# Patient Record
Sex: Male | Born: 1950
Health system: Southern US, Community
[De-identification: ages and names within clinical notes are randomized; demographics above are authoritative.]

## PROBLEM LIST (undated history)

## (undated) DIAGNOSIS — N289 Disorder of kidney and ureter, unspecified: Secondary | ICD-10-CM

---

## 2005-05-05 ENCOUNTER — Ambulatory Visit: Payer: Self-pay | Admitting: Internal Medicine

## 2005-05-07 DIAGNOSIS — C439 Malignant melanoma of skin, unspecified: Secondary | ICD-10-CM

## 2005-05-07 HISTORY — DX: Malignant melanoma of skin, unspecified: C43.9

## 2005-09-08 ENCOUNTER — Ambulatory Visit: Payer: Self-pay | Admitting: Internal Medicine

## 2006-02-09 ENCOUNTER — Ambulatory Visit: Payer: Self-pay | Admitting: Internal Medicine

## 2006-02-12 ENCOUNTER — Ambulatory Visit: Payer: Self-pay | Admitting: Cardiology

## 2006-03-23 ENCOUNTER — Ambulatory Visit: Payer: Self-pay | Admitting: Gastroenterology

## 2006-05-22 ENCOUNTER — Ambulatory Visit: Payer: Self-pay | Admitting: Internal Medicine

## 2007-06-22 ENCOUNTER — Ambulatory Visit: Payer: Self-pay | Admitting: Internal Medicine

## 2007-06-22 LAB — CONVERTED CEMR LAB
ALT: 42 units/L (ref 0–53)
AST: 53 units/L — ABNORMAL HIGH (ref 0–37)
Alkaline Phosphatase: 97 units/L (ref 39–117)
BUN: 10 mg/dL (ref 6–23)
Basophils Absolute: 0.1 10*3/uL (ref 0.0–0.1)
Basophils Relative: 1 % (ref 0.0–1.0)
Bilirubin, Direct: 0.1 mg/dL (ref 0.0–0.3)
CO2: 32 meq/L (ref 19–32)
CRP, High Sensitivity: 0.9
Chloride: 104 meq/L (ref 96–112)
Creatinine, Ser: 0.7 mg/dL (ref 0.4–1.5)
Eosinophils Relative: 4.8 % (ref 0.0–5.0)
GFR calc Af Amer: 151 mL/min
Glucose, Bld: 100 mg/dL — ABNORMAL HIGH (ref 70–99)
HCT: 39.6 % (ref 39.0–52.0)
Hemoglobin: 14.2 g/dL (ref 13.0–17.0)
Monocytes Absolute: 0.5 10*3/uL (ref 0.2–0.7)
Potassium: 4.4 meq/L (ref 3.5–5.1)
Total CHOL/HDL Ratio: 7.4
Triglycerides: 260 mg/dL (ref 0–149)
VLDL: 52 mg/dL — ABNORMAL HIGH (ref 0–40)
WBC: 6.4 10*3/uL (ref 4.5–10.5)

## 2007-07-01 ENCOUNTER — Ambulatory Visit: Payer: Self-pay | Admitting: Internal Medicine

## 2008-01-10 ENCOUNTER — Encounter: Payer: Self-pay | Admitting: Internal Medicine

## 2008-01-10 DIAGNOSIS — M1990 Unspecified osteoarthritis, unspecified site: Secondary | ICD-10-CM | POA: Insufficient documentation

## 2008-01-10 DIAGNOSIS — E663 Overweight: Secondary | ICD-10-CM | POA: Insufficient documentation

## 2008-01-10 DIAGNOSIS — M545 Low back pain: Secondary | ICD-10-CM

## 2008-01-10 DIAGNOSIS — B351 Tinea unguium: Secondary | ICD-10-CM | POA: Insufficient documentation

## 2008-01-10 DIAGNOSIS — K8020 Calculus of gallbladder without cholecystitis without obstruction: Secondary | ICD-10-CM | POA: Insufficient documentation

## 2008-06-26 ENCOUNTER — Ambulatory Visit: Payer: Self-pay | Admitting: Internal Medicine

## 2008-06-26 ENCOUNTER — Ambulatory Visit: Payer: Self-pay

## 2008-06-26 DIAGNOSIS — M2550 Pain in unspecified joint: Secondary | ICD-10-CM | POA: Insufficient documentation

## 2008-06-26 DIAGNOSIS — R609 Edema, unspecified: Secondary | ICD-10-CM

## 2008-11-28 DIAGNOSIS — C4491 Basal cell carcinoma of skin, unspecified: Secondary | ICD-10-CM

## 2008-11-28 HISTORY — DX: Basal cell carcinoma of skin, unspecified: C44.91

## 2011-01-07 NOTE — Assessment & Plan Note (Signed)
[facility]                             PULMONARY OFFICE NOTE   MARGARET, STAGGS                       MRN:          [id number]  DATE:07/01/2007                            DOB:          03/09/1951    HISTORY:  A 60 year old white male in for followup evaluation of obesity  complicated by hyperlipidemia.  He has made no progress in terms of  weight loss, but is careful about diet.  A year ago he was taking  multiple supplements which he stopped now.  The only thing he takes is a  baby aspirin daily.  He enjoys good health, however, with no significant  exertional chest pain, orthopnea, PND or leg swelling.   He also has no significant history of any TIA or claudication symptoms,  orthopnea, PND or leg swelling.   PAST MEDICAL HISTORY:  1. Mild obesity with target weight of 210.  2. Intermittent low back pain.  3. Intermittent neck pain and stiffness chronically.  4. Mild degenerative joint disease of the right elbow.  5. Onychomycosis.  6. Asymptomatic cholelithiasis, documented June 2007.  7. Acute urolithiasis within the past 3 months, resolved.   ALLERGIES:  None known.   MEDICATIONS:  Baby aspirin 1 daily.   SOCIAL HISTORY:  He has never smoked.  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  Previously  he was walking with his wife regularly.  She has developed breast cancer  and so he stopped walking and gained 5 pounds subsequently.   FAMILY HISTORY:  Positive for stroke in his father at age 30.  No one  has ever had premature heart disease or stroke.  Mother had colon cancer  in her 64s.  He is the next-to-the-last child of 4 siblings, none of  whom have premature heart disease, but all of whom have cholesterol  elevation.  No positive colon cancer, no prostate cancer in his family  to his knowledge.   REVIEW OF SYSTEMS:  Taken in detail on the worksheet, negative except as  outlined  above.   PHYSICAL EXAMINATION:  This is a pleasant, ambulatory white male in no  acute distress.  He is afebrile with normal vital signs with a weight of 255 pounds, up 5  pounds from previous visit, his all-time high, with a blood pressure of  110/64.  HEENT:  Ocular exam was done with limited funduscopy revealing no  significant retinal arterial change.  Nasal turbinates were normal.  Ear  canals were clear bilaterally.  Oropharynx was clear.  Dentition was  intact.  NECK:  Supple without cervical adenopathy or tenderness.  Trachea is  midline, no thyromegaly.  Carotid upstrokes were brisk, no bruits.  Lung fields are perfectly clear bilaterally to auscultation and  percussion.  HEART:  Regular rhythm without murmurs, gallops, or rubs with no  displacement of PMI.  ABDOMEN:  Soft, benign with no palpable organomegaly, masses or  tenderness.  No evidence of aortic enlargement.  The femoral pulses were present bilaterally, no bruits.  GENITOURINARY:  Testes descended bilaterally, no nodules.  He was  uncircumcised.  RECTAL:  Revealed mild BPH, smooth texture.  Stool was guaiac-negative.  No nodules.  EXTREMITIES:  Warm without calf tenderness, cyanosis, clubbing, or  edema.  He had minimal varicose veins and symmetric pulses.  NEUROLOGIC:  No focal deficits or pathologic reflexes.  MUSCULOSKELETAL EXAM:  Entirely normal except for minimum reduction in C-  spine rotation and abduction   LABORATORY DATA:  His LDL cholesterol is down from 180 a year ago to 168  now with a normal CRP.  His fasting blood sugar is 100 with normal  kidney function and liver function tests.  His TSH is normal and his HDL  is 38 which is unchanged from a year ago.   IMPRESSION:  1. Hyperlipidemia.  Target LDL less than 160 based on the fact that he      has no premature heart disease or stroke in his family.  I have      given him the options today which included starting a statin      because all of  his family is on these.  However, he would like to      work on continued diet and exercise, as despite gaining 5 pounds      since a year ago with diet, he has actually reduced his LDL by 20      points.  I have given him specific guidelines to help him and      offered nutrition consult although he declined this today.  2. Moderate obesity complicated by hyperlipidemia.  I have given him a      target weight of 210 or less, and explained very carefully to him      calorie balance issues and how it is possible to do this without      medications.  3. Health maintenance.  Record review indicates he was due a tetanus      shot.  We will have to call him back for this.  4. Nephrolithiasis.  I have recommended plenty of fluids and urology      followup p.r.n.  5. Asymptomatic cholelithiasis, no treatment needed.  6. Degenerative joint disease.  I have reminded him regarding the use      of ibuprofen on a p.r.n. basis.  7. Positive family history for colon cancer.  He is due for followup      by Dr. Cleotis Nipper in 2011.     Charlaine Dalton. Sherene Sires, MD, [facility]  Electronically Signed    MBW/MedQ  DD: 07/01/2007  DT: 07/01/2007  Job #: [id number]

## 2011-01-10 NOTE — Assessment & Plan Note (Signed)
[facility]                           GASTROENTEROLOGY OFFICE NOTE   NAME:Micheal [name]                       MRN:          [id number]  DATE:03/23/2006                            DOB:          21-Nov-1950    REASON FOR CONSULTATION:  Chest pain.   HISTORY OF PRESENT ILLNESS:  Mr. Micheal [name] is [name] pleasant 60 year old white  male referred through the courtesy of Dr. Sherene Sires for evaluation.  The last  nine months, he has been complaining of left lower chest discomfort.  When  he coughs, sneezes or has [name] bowel movement, he feels [name] fluttering sensation  described as movement in his left subcostal area.  It lasts seconds at [name]  time and is nonpainful.  He denies nausea, vomiting, pyrosis or abdominal  pain.  He underwent [name] chest CT in June 2007 that demonstrated [name] gallbladder  stone only.  Chest x-ray showed borderline cardiomegaly and questionable  mild congestion.   FAMILY HISTORY:  Pertinent for mother with colon cancer.  He was  colonoscoped in December 2006 which was normal.   PAST MEDICAL HISTORY:  Unremarkable.   FAMILY HISTORY:  As stated above.   MEDICATIONS:  Baby aspirin.   SOCIAL HISTORY:  He neither smokes nor drinks.  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   REVIEW OF SYSTEMS:  Reviewed and was negative.   PHYSICAL EXAMINATION:  VITAL SIGNS:  Pulse 68, blood pressure 110/60, weight  248.  HEENT:  EOMI. PERRLA. Sclerae are anicteric.  Conjunctivae are pink.  NECK:  Supple without thyromegaly, adenopathy or carotid bruits.  CHEST:  There are no areas of tenderness to palpation.  CARDIAC:  Regular rhythm; normal S1 S2.  There are no murmurs, gallops or  rubs.  ABDOMEN:  Bowel sounds are normoactive.  Abdomen is soft, non-tender and non-  distended.  There are no abdominal masses, tenderness, splenic enlargement  or hepatomegaly.  EXTREMITIES:  Full range of motion.  No cyanosis, clubbing or edema.  RECTAL:  Deferred.    IMPRESSION:  1.  Musculoskeletal chest pain.  2.  Cholelithiasis, asymptomatic.  3.  Family history of colorectal CA.   RECOMMENDATION:  1.  No further GI workup.  2.  Follow up colonoscopy in approximately four years.                                   Barbette Hair. Arlyce Dice, MD, [facility]   RDK/MedQ  DD:  03/23/2006  DT:  03/23/2006  Job #:  [id number]

## 2011-01-10 NOTE — Assessment & Plan Note (Signed)
HEALTHCARE                               PULMONARY OFFICE NOTE   CALLOWAY, ANDRUS A                       MRN:          [id number]  DATE:05/22/2006                            DOB:          07/20/1951    PRIMARY SERVICE HEALTHCARE EVALUATION   CHIEF COMPLAINT:  Neck pain.   HISTORY:  A 60 year old white male followed here for obesity complicated by  hyperlipidemia presents with 1 year history of intermittent neck stiffness  with no radicular features.  He denies any changes with weather,  environmental changes, associated headache, nausea, or exertional symptoms.   PAST MEDICAL HISTORY:  1. Mild obesity with a target weight of around 210.  2. Intermittent low back pain.  3. Mild DJD the right elbow.  4. Onychomycosis.  5. Asymptomatic cholelithiasis (see abdominal CT scan February 12, 2006 for      evaluation of left upper quadrant pain, which has resolved).   ALLERGIES:  None known.   MEDICINES:  1. Baby aspirin 1 daily.  2. Multivite 1 daily.  3. Multiple alternative agents listed in the med sheet dated May 22, 2006.   SOCIAL HISTORY:  He has never smoked.  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   FAMILY HISTORY:  Positive for stroke in his father at age 25.  Mother had  colon cancer in her 99s.  He has 3 brothers, 2 older, 1 younger, all healthy  with no premature heart disease in the family.  Everyone has high  cholesterol.  No prostate or colon cancer to his knowledge.   REVIEW OF SYSTEMS:  Taken in detail on the worksheet.  Significant for the  problems as outlined above.   PHYSICAL EXAMINATION:  This is a pleasant, ambulatory, robust white male in  no acute distress with a weight of 245 pounds, which is up another 5 pounds  form a year ago.  HEENT:  Ocular exam was done with limited funduscopy revealing no  significant retinal arterial change.  Oropharynx  is clear.  Dentition is  intact.  Nasal turbinates normal.  Ear canals are clear bilaterally.  NECK:  Supple without cervical adenopathy or tenderness.  There was a small  lipoma present below the left occiput.  There was minimal crepitans.  Carotid upstrokes were brisk bilaterally.  No bruits.  CHEST:  Completely clear bilaterally to auscultation and percussion.  There  is a regular rate and rhythm without murmur, gallop, or rub present.  ABDOMEN:  Soft, benign with no palpable organomegaly, mass, or tenderness.  GENITOURINARY:  Testes descended bilaterally.  No nodules. No evidence of  inguinal hernia.  RECTAL:  Mild BPH, smooth texture.  Stool guaiac was negative.  EXTREMITIES:  Warm without calf tenderness, cyanosis, clubbing, edema.  Pedal pulses were present bilaterally.  NEUROLOGIC:  No focal deficits or pathologic reflex.  SKIN:  Warm and dry.   LAB DATA:  EKG was normal.  Chest x-ray was normal.  Serum cholesterol  revealed  an LDL of 183 with an HDL of 37 and a CRP was normal.  Chemistry  profile, TSH were normal.  Urinalysis was unremarkable.   IMPRESSION:  1. New onset musculoskeletal neck pain.  He does have a small lipoma      beneath his left occiput, but I do not believe this is directly related      to the pain.  I have recommended neck-stretching exercises with p.r.n.      nonsteroidals and spent extra time teaching him optimal neck      calisthenics, both in terms of isometric and isotonic exercises that he      can do.  If this is not satisfactory, an orthopedic evaluation should      be considered.  2. Obesity complicated by hyperlipidemia.  His target LDL is less than 160      in the absence of additional risk factors.  He was 180 a year ago and      is 183 now, which is not surprising given the fact that his weight is      gradually increasing.  Advised him by phone tree that the options are:      a.     Accept a higher risk of cardiovascular morbidity mortality,  or      b.     Follow a reasonable diet and exercise program, or      c.     Start a Statin.  Followup either way should be every 3 months       sooner if needed.  3. Health maintenance:  He should receive a tetanus in 2008.  I gave him      the opportunity to do prostate specific antigen screening, which he      declined after being informed of the risks, benefits, and alternatives      (given the fact that he is Caucasian with negative family history and      normal exam the false-positive rate for prostate specific antigen far      exceeds the true-positives).            ______________________________  Charlaine Dalton Sherene Sires, MD, [facility]      MBW/MedQ  DD:  05/25/2006  DT:  05/26/2006  Job #:  [id number]

## 2012-11-09 DIAGNOSIS — D229 Melanocytic nevi, unspecified: Secondary | ICD-10-CM

## 2012-11-09 HISTORY — DX: Melanocytic nevi, unspecified: D22.9

## 2013-08-31 ENCOUNTER — Ambulatory Visit (INDEPENDENT_AMBULATORY_CARE_PROVIDER_SITE_OTHER): Payer: BC Managed Care – PPO | Admitting: Podiatry

## 2013-08-31 ENCOUNTER — Ambulatory Visit (INDEPENDENT_AMBULATORY_CARE_PROVIDER_SITE_OTHER): Payer: BC Managed Care – PPO

## 2013-08-31 ENCOUNTER — Encounter: Payer: Self-pay | Admitting: Podiatry

## 2013-08-31 VITALS — BP 131/69 | HR 58 | Resp 16 | Ht 73.0 in | Wt 240.0 lb

## 2013-08-31 DIAGNOSIS — M722 Plantar fascial fibromatosis: Secondary | ICD-10-CM

## 2013-08-31 MED ORDER — TRIAMCINOLONE ACETONIDE 10 MG/ML IJ SUSP
10.0000 mg | Freq: Once | INTRAMUSCULAR | Status: AC
  Administered 2013-08-31: 10 mg

## 2013-08-31 MED ORDER — MELOXICAM 15 MG PO TABS: 15.0000 mg | ORAL_TABLET | Freq: Every day | ORAL | Status: DC

## 2013-08-31 NOTE — Patient Instructions (Signed)
Plantar Fasciitis (Heel Spur Syndrome) with Rehab The plantar fascia is a fibrous, ligament-like, soft-tissue structure that spans the bottom of the foot. Plantar fasciitis is a condition that causes pain in the foot due to inflammation of the tissue. SYMPTOMS   Pain and tenderness on the underneath side of the foot.  Pain that worsens with standing or walking. CAUSES  Plantar fasciitis is caused by irritation and injury to the plantar fascia on the underneath side of the foot. Common mechanisms of injury include:  Direct trauma to bottom of the foot.  Damage to a small nerve that runs under the foot where the main fascia attaches to the heel bone.  Stress placed on the plantar fascia due to bone spurs. RISK INCREASES WITH:   Activities that place stress on the plantar fascia (running, jumping, pivoting, or cutting).  Poor strength and flexibility.  Improperly fitted shoes.  Tight calf muscles.  Flat feet.  Failure to warm-up properly before activity.  Obesity. PREVENTION  Warm up and stretch properly before activity.  Allow for adequate recovery between workouts.  Maintain physical fitness:  Strength, flexibility, and endurance.  Cardiovascular fitness.  Maintain a health body weight.  Avoid stress on the plantar fascia.  Wear properly fitted shoes, including arch supports for individuals who have flat feet.  PROGNOSIS  If treated properly, then the symptoms of plantar fasciitis usually resolve without surgery. However, occasionally surgery is necessary.  RELATED COMPLICATIONS   Recurrent symptoms that may result in a chronic condition.  Problems of the lower back that are caused by compensating for the injury, such as limping.  Pain or weakness of the foot during push-off following surgery.  Chronic inflammation, scarring, and partial or complete fascia tear, occurring more often from repeated injections.  TREATMENT  Treatment initially involves the  use of ice and medication to help reduce pain and inflammation. The use of strengthening and stretching exercises may help reduce pain with activity, especially stretches of the Achilles tendon. These exercises may be performed at home or with a therapist. Your caregiver may recommend that you use heel cups of arch supports to help reduce stress on the plantar fascia. Occasionally, corticosteroid injections are given to reduce inflammation. If symptoms persist for greater than 6 months despite non-surgical (conservative), then surgery may be recommended.   MEDICATION   If pain medication is necessary, then nonsteroidal anti-inflammatory medications, such as aspirin and ibuprofen, or other minor pain relievers, such as acetaminophen, are often recommended.  Do not take pain medication within 7 days before surgery.  Prescription pain relievers may be given if deemed necessary by your caregiver. Use only as directed and only as much as you need.  Corticosteroid injections may be given by your caregiver. These injections should be reserved for the most serious cases, because they may only be given a certain number of times.  HEAT AND COLD  Cold treatment (icing) relieves pain and reduces inflammation. Cold treatment should be applied for 10 to 15 minutes every 2 to 3 hours for inflammation and pain and immediately after any activity that aggravates your symptoms. Use ice packs or massage the area with a piece of ice (ice massage).  Heat treatment may be used prior to performing the stretching and strengthening activities prescribed by your caregiver, physical therapist, or athletic trainer. Use a heat pack or soak the injury in warm water.  SEEK IMMEDIATE MEDICAL CARE IF:  Treatment seems to offer no benefit, or the condition worsens.  Any medications   produce adverse side effects.  EXERCISES- RANGE OF MOTION (ROM) AND STRETCHING EXERCISES - Plantar Fasciitis (Heel Spur Syndrome) These exercises  may help you when beginning to rehabilitate your injury. Your symptoms may resolve with or without further involvement from your physician, physical therapist or athletic trainer. While completing these exercises, remember:   Restoring tissue flexibility helps normal motion to return to the joints. This allows healthier, less painful movement and activity.  An effective stretch should be held for at least 30 seconds.  A stretch should never be painful. You should only feel a gentle lengthening or release in the stretched tissue.  RANGE OF MOTION - Toe Extension, Flexion  Sit with your right / left leg crossed over your opposite knee.  Grasp your toes and gently pull them back toward the top of your foot. You should feel a stretch on the bottom of your toes and/or foot.  Hold this stretch for 10 seconds.  Now, gently pull your toes toward the bottom of your foot. You should feel a stretch on the top of your toes and or foot.  Hold this stretch for 10 seconds. Repeat  times. Complete this stretch 3 times per day.   RANGE OF MOTION - Ankle Dorsiflexion, Active Assisted  Remove shoes and sit on a chair that is preferably not on a carpeted surface.  Place right / left foot under knee. Extend your opposite leg for support.  Keeping your heel down, slide your right / left foot back toward the chair until you feel a stretch at your ankle or calf. If you do not feel a stretch, slide your bottom forward to the edge of the chair, while still keeping your heel down.  Hold this stretch for 10 seconds. Repeat 3 times. Complete this stretch 2 times per day.   STRETCH  Gastroc, Standing  Place hands on wall.  Extend right / left leg, keeping the front knee somewhat bent.  Slightly point your toes inward on your back foot.  Keeping your right / left heel on the floor and your knee straight, shift your weight toward the wall, not allowing your back to arch.  You should feel a gentle stretch  in the right / left calf. Hold this position for 10 seconds. Repeat 3 times. Complete this stretch 2 times per day.  STRETCH  Soleus, Standing  Place hands on wall.  Extend right / left leg, keeping the other knee somewhat bent.  Slightly point your toes inward on your back foot.  Keep your right / left heel on the floor, bend your back knee, and slightly shift your weight over the back leg so that you feel a gentle stretch deep in your back calf.  Hold this position for 10 seconds. Repeat 3 times. Complete this stretch 2 times per day.  STRETCH  Gastrocsoleus, Standing  Note: This exercise can place a lot of stress on your foot and ankle. Please complete this exercise only if specifically instructed by your caregiver.   Place the ball of your right / left foot on a step, keeping your other foot firmly on the same step.  Hold on to the wall or a rail for balance.  Slowly lift your other foot, allowing your body weight to press your heel down over the edge of the step.  You should feel a stretch in your right / left calf.  Hold this position for 10 seconds.  Repeat this exercise with a slight bend in your right /   left knee. Repeat 3 times. Complete this stretch 2 times per day.   STRENGTHENING EXERCISES - Plantar Fasciitis (Heel Spur Syndrome)  These exercises may help you when beginning to rehabilitate your injury. They may resolve your symptoms with or without further involvement from your physician, physical therapist or athletic trainer. While completing these exercises, remember:   Muscles can gain both the endurance and the strength needed for everyday activities through controlled exercises.  Complete these exercises as instructed by your physician, physical therapist or athletic trainer. Progress the resistance and repetitions only as guided.  STRENGTH - Towel Curls  Sit in a chair positioned on a non-carpeted surface.  Place your foot on a towel, keeping your heel  on the floor.  Pull the towel toward your heel by only curling your toes. Keep your heel on the floor. Repeat 3 times. Complete this exercise 2 times per day.  STRENGTH - Ankle Inversion  Secure one end of a rubber exercise band/tubing to a fixed object (table, pole). Loop the other end around your foot just before your toes.  Place your fists between your knees. This will focus your strengthening at your ankle.  Slowly, pull your big toe up and in, making sure the band/tubing is positioned to resist the entire motion.  Hold this position for 10 seconds.  Have your muscles resist the band/tubing as it slowly pulls your foot back to the starting position. Repeat 3 times. Complete this exercises 2 times per day.  Document Released: 08/11/2005 Document Revised: 11/03/2011 Document Reviewed: 11/23/2008 [employer] Patient Information 2014 [employer]  Onychomycosis/Fungal Toenails  WHAT IS IT? An infection that lies within the keratin of your nail plate that is caused by a fungus.  WHY ME? Fungal infections affect all ages, sexes, races, and creeds.  There may be many factors that predispose you to a fungal infection such as age, coexisting medical conditions such as diabetes, or an autoimmune disease; stress, medications, fatigue, genetics, etc.  Bottom line: fungus thrives in a warm, moist environment and your shoes offer such a location.  IS IT CONTAGIOUS? Theoretically, yes.  You do not want to share shoes, nail clippers or files with someone who has fungal toenails.  Walking around barefoot in the same room or sleeping in the same bed is unlikely to transfer the organism.  It is important to realize, however, that fungus can spread easily from one nail to the next on the same foot.  HOW DO WE TREAT THIS?  There are several ways to treat this condition.  Treatment may depend on many factors such as age, medications, pregnancy, liver and kidney conditions, etc.  It is best to ask your  doctor which options are available to you.  1. No treatment.   Unlike many other medical concerns, you can live with this condition.  However for many people this can be a painful condition and may lead to ingrown toenails or a bacterial infection.  It is recommended that you keep the nails cut short to help reduce the amount of fungal nail. 2. Topical treatment.  These range from herbal remedies to prescription strength nail lacquers.  About 40-50% effective, topicals require twice daily application for approximately 9 to 12 months or until an entirely new nail has grown out.  The most effective topicals are medical grade medications available through physicians offices. 3. Oral antifungal medications.  With an 80-90% cure rate, the most common oral medication requires 3 to 4 months of therapy and stays   in your system for a year as the new nail grows out.  Oral antifungal medications do require blood work to make sure it is a safe drug for you.  A liver function panel will be performed prior to starting the medication and after the first month of treatment.  It is important to have the blood work performed to avoid any harmful side effects.  In general, this medication safe but blood work is required. 4. Laser Therapy.  This treatment is performed by applying a specialized laser to the affected nail plate.  This therapy is noninvasive, fast, and non-painful.  It is not covered by insurance and is therefore, out of pocket.  The results have been very good with a 80-95% cure rate.  The [facility] is the only practice in the area to offer this therapy. 5. Permanent Nail Avulsion.  Removing the entire nail so that a new nail will not grow back. 

## 2013-08-31 NOTE — Progress Notes (Signed)
   Subjective:    Patient ID: Micheal [name], male    DOB: [birth year], 63 y.o.   MRN: [id number]  HPI Comments: "My heel hurts"  Pt states that he has had burning and aching in the plantar heel right foot for about 3-4 weeks. It is getting worse. Has tried insoles in shoes and Ibuprofen with he said helped at least 80%.   Noticed that pt had thick and discolored toenails on both feet. I talked with him about laser treatment since pt has seen a dermatologist and was recommended NOT to use oral meds. He is currently using apple cider vinegar soaks to help appearance.  Foot Pain      Review of Systems  Cardiovascular: Positive for leg swelling.  Musculoskeletal: Positive for back pain.  Allergic/Immunologic: Positive for environmental allergies.  All other systems reviewed and are negative.       Objective:   Physical Exam        Assessment & Plan:

## 2013-09-01 NOTE — Progress Notes (Signed)
Subjective:     Patient ID: Micheal [name], male   DOB: Apr 02, 1951, 63 y.o.   MRN: [id number]  Foot Pain   patient presents with pain in the right heel of at least one-month duration that is worse after sitting and now hurts at all times during the day. Does not remember injury to the area and does not remember any other trauma that may have occurred. Patient is found to have brittle thickened nails 1-5 both feet that have been checked by a dermatologist   Review of Systems  All other systems reviewed and are negative.       Objective:   Physical Exam  Nursing note and vitals reviewed. Constitutional: He is oriented to person, place, and time.  Cardiovascular: Intact distal pulses.   Musculoskeletal: Normal range of motion.  Neurological: He is oriented to person, place, and time.   neurovascular status intact with no health history changes noted and muscle strength adequate with no equinus. Patient is found to have discomfort on the plantar right heel at the insertion of the tendon into the calcaneus and is also noted to have thick yellow to form nails 1-5 both feet that are nontender     Assessment:     Plantar fasciitis of the right heel and mycotic nail infection 1-5 of both feet    Plan:     H&P performed in both conditions discussed along with x-ray review. Injected the right plantar fascia 3 mg Kenalog 5 mg Xylocaine Marcaine mixture and applied fascially brace with instructions and discussed the possibility of nail treatment which we will defer currently reappoint in 1 week to recheck

## 2013-09-07 ENCOUNTER — Ambulatory Visit: Payer: BC Managed Care – PPO | Admitting: Podiatry

## 2013-09-19 ENCOUNTER — Ambulatory Visit (INDEPENDENT_AMBULATORY_CARE_PROVIDER_SITE_OTHER): Payer: BC Managed Care – PPO | Admitting: Podiatry

## 2013-09-19 ENCOUNTER — Encounter: Payer: Self-pay | Admitting: Podiatry

## 2013-09-19 VITALS — BP 129/70 | HR 73 | Resp 16

## 2013-09-19 DIAGNOSIS — M722 Plantar fascial fibromatosis: Secondary | ICD-10-CM

## 2013-09-19 DIAGNOSIS — B351 Tinea unguium: Secondary | ICD-10-CM

## 2013-09-19 LAB — HEPATIC FUNCTION PANEL
ALT: 25 U/L (ref 0–53)
AST: 35 U/L (ref 0–37)
Albumin: 4.3 g/dL (ref 3.5–5.2)
Alkaline Phosphatase: 98 U/L (ref 39–117)
BILIRUBIN DIRECT: 0.1 mg/dL (ref 0.0–0.3)
BILIRUBIN INDIRECT: 0.6 mg/dL (ref 0.0–0.9)
TOTAL PROTEIN: 7 g/dL (ref 6.0–8.3)
Total Bilirubin: 0.7 mg/dL (ref 0.3–1.2)

## 2013-09-19 MED ORDER — TERBINAFINE HCL 250 MG PO TABS
250.0000 mg | ORAL_TABLET | Freq: Every day | ORAL | Status: DC
Start: 2013-09-19 — End: 2021-10-08

## 2013-09-19 MED ORDER — TRIAMCINOLONE ACETONIDE 10 MG/ML IJ SUSP
10.0000 mg | Freq: Once | INTRAMUSCULAR | Status: AC
  Administered 2013-09-19: 10 mg

## 2013-09-19 NOTE — Progress Notes (Signed)
Subjective:     Patient ID: Micheal [name], male   DOB: October 04, 1950, 63 y.o.   MRN: [id number]  HPI patient presents stating my toenails are still thick and my heel is improving quite a bit but still tender   Review of Systems     Objective:   Physical Exam Neurovascular status unchanged with no change in health history and discomfort still noted plantar aspect right heel but improved from previously and nail disease 1-5 both feet    Assessment:     Plantar fasciitis improved but still present right and mycotic nail infection x10    Plan:     Reinjected the plantar fascia 3 mg Kenalog sounds like a Marcaine mixture and instructed on oral topical and possibility for laser in the future for the nail disease. Lamisil 90 days after blood work to check for liver function will be initiated

## 2013-10-07 ENCOUNTER — Telehealth: Payer: Self-pay | Admitting: *Deleted

## 2013-10-07 NOTE — Telephone Encounter (Signed)
Pt asked for lab results so he could begin his Lamisil.  Dr Valentina Lucks reviewed the 09/19/2013 lab and states can begin Lamisil.  Orders to pt.

## 2013-12-19 ENCOUNTER — Telehealth: Payer: Self-pay | Admitting: *Deleted

## 2013-12-19 NOTE — Telephone Encounter (Signed)
My husband asked me to call.  He finished the medicine for fungus one month ago.  Where do we go from here?  He doesn't have a follow-up appointment.  What's the next step?  Call me back, he's difficult to reach.  I left her a message to call and schedule a follow-up appointment with Dr. Paulla Dolly in about a month for a toenail check.

## 2014-01-23 ENCOUNTER — Ambulatory Visit: Payer: BC Managed Care – PPO | Admitting: Podiatry

## 2014-06-16 ENCOUNTER — Other Ambulatory Visit: Payer: Self-pay | Admitting: Internal Medicine

## 2014-06-16 ENCOUNTER — Ambulatory Visit
Admission: RE | Admit: 2014-06-16 | Discharge: 2014-06-16 | Disposition: A | Discharge status: Home / Self Care | Payer: BC Managed Care – PPO | Source: Ambulatory Visit | Attending: Internal Medicine | Admitting: Internal Medicine

## 2014-06-16 DIAGNOSIS — Z579 Occupational exposure to unspecified risk factor: Secondary | ICD-10-CM

## 2018-02-02 DIAGNOSIS — H2513 Age-related nuclear cataract, bilateral: Secondary | ICD-10-CM | POA: Diagnosis not present

## 2018-02-02 DIAGNOSIS — H43813 Vitreous degeneration, bilateral: Secondary | ICD-10-CM | POA: Diagnosis not present

## 2018-02-02 DIAGNOSIS — H40023 Open angle with borderline findings, high risk, bilateral: Secondary | ICD-10-CM | POA: Diagnosis not present

## 2018-03-27 ENCOUNTER — Emergency Department (HOSPITAL_COMMUNITY)
Admission: EM | Admit: 2018-03-27 | Discharge: 2018-03-28 | Disposition: A | Discharge status: Home / Self Care | Payer: Medicare Other | Attending: Emergency Medicine | Admitting: Emergency Medicine

## 2018-03-27 ENCOUNTER — Emergency Department (HOSPITAL_COMMUNITY): Payer: Medicare Other

## 2018-03-27 ENCOUNTER — Other Ambulatory Visit: Payer: Self-pay

## 2018-03-27 ENCOUNTER — Encounter (HOSPITAL_COMMUNITY): Payer: Self-pay | Admitting: Emergency Medicine

## 2018-03-27 DIAGNOSIS — Z79899 Other long term (current) drug therapy: Secondary | ICD-10-CM | POA: Diagnosis not present

## 2018-03-27 DIAGNOSIS — N132 Hydronephrosis with renal and ureteral calculous obstruction: Secondary | ICD-10-CM | POA: Diagnosis not present

## 2018-03-27 DIAGNOSIS — Z7982 Long term (current) use of aspirin: Secondary | ICD-10-CM | POA: Insufficient documentation

## 2018-03-27 DIAGNOSIS — R109 Unspecified abdominal pain: Secondary | ICD-10-CM | POA: Diagnosis not present

## 2018-03-27 DIAGNOSIS — K8020 Calculus of gallbladder without cholecystitis without obstruction: Secondary | ICD-10-CM

## 2018-03-27 DIAGNOSIS — N23 Unspecified renal colic: Secondary | ICD-10-CM | POA: Diagnosis not present

## 2018-03-27 DIAGNOSIS — R1031 Right lower quadrant pain: Secondary | ICD-10-CM | POA: Diagnosis not present

## 2018-03-27 HISTORY — DX: Disorder of kidney and ureter, unspecified: N28.9

## 2018-03-27 LAB — URINALYSIS, ROUTINE W REFLEX MICROSCOPIC
BACTERIA UA: NONE SEEN
Bilirubin Urine: NEGATIVE
Glucose, UA: NEGATIVE mg/dL
Ketones, ur: NEGATIVE mg/dL
Leukocytes, UA: NEGATIVE
Nitrite: NEGATIVE
PROTEIN: NEGATIVE mg/dL
Specific Gravity, Urine: 1.027 (ref 1.005–1.030)
pH: 5 (ref 5.0–8.0)

## 2018-03-27 LAB — BASIC METABOLIC PANEL
ANION GAP: 8 (ref 5–15)
BUN: 23 mg/dL (ref 8–23)
CO2: 24 mmol/L (ref 22–32)
Calcium: 8.9 mg/dL (ref 8.9–10.3)
Chloride: 107 mmol/L (ref 98–111)
Creatinine, Ser: 1.01 mg/dL (ref 0.61–1.24)
GFR calc Af Amer: >60 mL/min (ref >60)
Glucose, Bld: 113 mg/dL — ABNORMAL HIGH (ref 70–99)
Potassium: 4.1 mmol/L (ref 3.5–5.1)
SODIUM: 139 mmol/L (ref 135–145)

## 2018-03-27 LAB — CBC
HEMATOCRIT: 41.7 % (ref 39.0–52.0)
HEMOGLOBIN: 13.9 g/dL (ref 13.0–17.0)
MCH: 30 pg (ref 26.0–34.0)
MCHC: 33.3 g/dL (ref 30.0–36.0)
MCV: 90.1 fL (ref 78.0–100.0)
Platelets: 253 10*3/uL (ref 150–400)
RBC: 4.63 MIL/uL (ref 4.22–5.81)
RDW: 12.5 % (ref 11.5–15.5)
WBC: 10.8 10*3/uL — AB (ref 4.0–10.5)

## 2018-03-27 MED ORDER — OXYCODONE-ACETAMINOPHEN 5-325 MG PO TABS
1.0000 | ORAL_TABLET | Freq: Once | ORAL | Status: AC
  Administered 2018-03-27: 1 via ORAL
  Filled 2018-03-27: qty 1

## 2018-03-27 MED ORDER — ONDANSETRON 4 MG PO TBDP
4.0000 mg | ORAL_TABLET | Freq: Once | ORAL | Status: AC | PRN
  Administered 2018-03-27: 4 mg via ORAL
  Filled 2018-03-27: qty 1

## 2018-03-27 NOTE — ED Triage Notes (Signed)
Pt c/o right sided flank pain x 2 days, denies urinary symptoms, hx kidney stones. C/o nausea, denies vomiting.

## 2018-03-28 DIAGNOSIS — N23 Unspecified renal colic: Secondary | ICD-10-CM | POA: Diagnosis not present

## 2018-03-28 MED ORDER — ONDANSETRON 4 MG PO TBDP: ORAL_TABLET | ORAL | 0 refills | Status: DC

## 2018-03-28 MED ORDER — KETOROLAC TROMETHAMINE 60 MG/2ML IM SOLN
60.0000 mg | Freq: Once | INTRAMUSCULAR | Status: AC
  Administered 2018-03-28: 60 mg via INTRAMUSCULAR
  Filled 2018-03-28: qty 2

## 2018-03-28 MED ORDER — OXYCODONE HCL 5 MG PO TABS: 5.0000 mg | ORAL_TABLET | Freq: Four times a day (QID) | ORAL | 0 refills | Status: AC | PRN

## 2018-03-28 MED ORDER — TAMSULOSIN HCL 0.4 MG PO CAPS: 0.4000 mg | ORAL_CAPSULE | Freq: Two times a day (BID) | ORAL | 0 refills | Status: DC

## 2018-03-28 NOTE — ED Provider Notes (Signed)
[facility]PART[facility] Provider Note   CSN: [id number] Arrival date & time: 03/27/18  1824     History   Chief Complaint Chief Complaint  Patient presents with  . Flank Pain    HPI Micheal [name] is a 67 y.o. male with a hx of nephrolithiasis approximately 1 decade ago presents to the Emergency Department complaining of gradual, persistent, progressively worsening right flank pain onset earlier this afternoon.  Patient reported associated radiation of the pain to his right lower abdomen.  Additionally he has had nausea without vomiting.  Patient reports that 10 years ago when he had his last kidney stone he did have vomiting.  Patient reports the pain is similar to that.  Patient reports taking ibuprofen with moderate relief of his pain.  Nothing seems to make his pain worse.  Patient was given Percocet here in the emergency department with moderate relief and Zofran with relief of his nausea.  Pt denies fever, chills, headache, neck pain, chest pain, shortness of breath, vomiting, weakness, dizziness, syncope.     The history is provided by the patient and medical records. No language interpreter was used.    Past Medical History:  Diagnosis Date  . Renal disorder     Patient Active Problem List   Diagnosis Date Noted  . ARTHRALGIA UNSPECIFIED SITE 06/26/2008  . EDEMA 06/26/2008  . ONYCHOMYCOSIS 01/10/2008  . OBESITY, MILD 01/10/2008  . CHOLELITHIASIS, ASYMPTOMATIC 01/10/2008  . DEGENERATIVE JOINT DISEASE 01/10/2008  . LOW BACK PAIN, MILD 01/10/2008    History reviewed. No pertinent surgical history.      Home Medications    Prior to Admission medications   Medication Sig Start Date End Date Taking? Authorizing Provider  aspirin 81 MG tablet Take 81 mg by mouth daily.    [provider]  DOXYCYCLINE HYCLATE PO Take by mouth.    [provider]  meloxicam (MOBIC) 15 MG tablet Take 1 tablet (15 mg total) by mouth daily.  08/31/13   Wallene Huh, DPM  ondansetron (ZOFRAN ODT) 4 MG disintegrating tablet 4mg  ODT q4 hours prn nausea/vomit 03/28/18   Inaya Gillham, Jarrett Soho, PA-C  oxyCODONE (ROXICODONE) 5 MG immediate release tablet Take 1 tablet (5 mg total) by mouth every 6 (six) hours as needed for severe pain or breakthrough pain. 03/28/18   Duward Allbritton, Jarrett Soho, PA-C  tamsulosin (FLOMAX) 0.4 MG CAPS capsule Take 1 capsule (0.4 mg total) by mouth 2 (two) times daily. 03/28/18   Graves Nipp, Jarrett Soho, PA-C  terbinafine (LAMISIL) 250 MG tablet Take 1 tablet (250 mg total) by mouth daily. 09/19/13   Wallene Huh, DPM    Family History No family history on file.  Social History Social History   Tobacco Use  . Smoking status: Never Smoker  . Smokeless tobacco: Never Used  Substance Use Topics  . Alcohol use: No  . Drug use: Not on file     Allergies   Patient has no known allergies.   Review of Systems Review of Systems  Constitutional: Negative for appetite change, diaphoresis, fatigue, fever and unexpected weight change.  HENT: Negative for mouth sores.   Eyes: Negative for visual disturbance.  Respiratory: Negative for cough, chest tightness, shortness of breath and wheezing.   Cardiovascular: Negative for chest pain.  Gastrointestinal: Positive for nausea. Negative for abdominal pain, constipation, diarrhea and vomiting.  Endocrine: Negative for polydipsia, polyphagia and polyuria.  Genitourinary: Positive for flank pain. Negative for dysuria, frequency, hematuria and urgency.  Musculoskeletal: Negative for  back pain and neck stiffness.  Skin: Negative for rash.  Allergic/Immunologic: Negative for immunocompromised state.  Neurological: Negative for syncope, light-headedness and headaches.  Hematological: Does not bruise/bleed easily.  Psychiatric/Behavioral: Negative for sleep disturbance. The patient is not nervous/anxious.      Physical Exam Updated Vital Signs BP (!) 161/70   Pulse (!) 52    Temp 98.1 F (36.7 C) (Oral)   Resp 15   SpO2 96%   Physical Exam  Constitutional: He appears well-developed and well-nourished. No distress.  Awake, alert, nontoxic appearance  HENT:  Head: Normocephalic and atraumatic.  Mouth/Throat: Oropharynx is clear and moist. No oropharyngeal exudate.  Eyes: Conjunctivae are normal. No scleral icterus.  Neck: Normal range of motion. Neck supple.  Cardiovascular: Normal rate, regular rhythm and intact distal pulses.  Pulmonary/Chest: Effort normal and breath sounds normal. No respiratory distress. He has no wheezes.  Equal chest expansion  Abdominal: Soft. Bowel sounds are normal. He exhibits no mass. There is no tenderness. There is CVA tenderness ( right). There is no rebound and no guarding.  Musculoskeletal: Normal range of motion. He exhibits no edema.  Neurological: He is alert.  Speech is clear and goal oriented Moves extremities without ataxia  Skin: Skin is warm and dry. He is not diaphoretic.  Psychiatric: He has a normal mood and affect.  Nursing note and vitals reviewed.    ED Treatments / Results  Labs (all labs ordered are listed, but only abnormal results are displayed) Labs Reviewed  URINALYSIS, ROUTINE W REFLEX MICROSCOPIC - Abnormal; Notable for the following components:      Result Value   Hgb urine dipstick MODERATE (*)    All other components within normal limits  BASIC METABOLIC PANEL - Abnormal; Notable for the following components:   Glucose, Bld 113 (*)    All other components within normal limits  CBC - Abnormal; Notable for the following components:   WBC 10.8 (*)    All other components within normal limits     Radiology Ct Renal Stone Study  Result Date: 03/27/2018 CLINICAL DATA:  67 y/o  M; right flank pain for 2 days. EXAM: CT ABDOMEN AND PELVIS WITHOUT CONTRAST TECHNIQUE: Multidetector CT imaging of the abdomen and pelvis was performed following the standard protocol without IV contrast.  COMPARISON:  02/12/2006 CT of the abdomen and pelvis. FINDINGS: Lower chest: No acute abnormality. Hepatobiliary: No focal liver abnormality is seen. Cholelithiasis. No gallbladder wall thickening or biliary ductal dilatation. Pancreas: Unremarkable. No pancreatic ductal dilatation or surrounding inflammatory changes. Spleen: Normal in size without focal abnormality. Adrenals/Urinary Tract: Normal adrenal glands. Normal left kidney. No left hydronephrosis. Mild right hydronephrosis and perinephric stranding with 3 mm stone in the distal right ureter at the ureterovesicular junction (series 3, image 83). Normal bladder. Stomach/Bowel: Stomach is within normal limits. Appendix appears normal. No evidence of bowel wall thickening, distention, or inflammatory changes. Vascular/Lymphatic: Aortic atherosclerosis. No enlarged abdominal or pelvic lymph nodes. Reproductive: Prostatic calcifications. Other: No abdominal wall hernia or abnormality. No abdominopelvic ascites. Musculoskeletal: Diffuse mixed lucency and sclerosis throughout the left ilium with cortical thickening compatible with Paget's disease. Mild levocurvature of the lumbar spine and grade 1 L3-4 anterolisthesis. Lumbar spondylosis with multilevel disc and prominent facet degenerative changes greatest at the L4-5 and L5-S1 levels. No acute fracture. IMPRESSION: 1. Mild right hydronephrosis with a 3 mm stone in the distal right ureter at the ureterovesicular junction. 2. Cholelithiasis. 3. Aortic atherosclerosis. Electronically Signed   By: Mia Creek  [place]-Stratton M.D.   On: 03/27/2018 22:59    Procedures Procedures (including critical care time)  Medications Ordered in ED Medications  ondansetron (ZOFRAN-ODT) disintegrating tablet 4 mg (4 mg Oral Given 03/27/18 1839)  oxyCODONE-acetaminophen (PERCOCET/ROXICET) 5-325 MG per tablet 1 tablet (1 tablet Oral Given 03/27/18 2055)  oxyCODONE-acetaminophen (PERCOCET/ROXICET) 5-325 MG per tablet 1 tablet (1  tablet Oral Given 03/27/18 2356)  ketorolac (TORADOL) injection 60 mg (60 mg Intramuscular Given 03/28/18 0028)     Initial Impression / Assessment and Plan / ED Course  I have reviewed the triage vital signs and the nursing notes.  Pertinent labs & imaging results that were available during my care of the patient were reviewed by me and considered in my medical decision making (see chart for details).     Patient presents to the emergency department with right-sided flank pain.  Labs show slightly elevated white blood cell count and moderate hemoglobin in his urine but no evidence of urinary tract infection.  Creatinine is normal today.  Patient has no significant medical problems.  He is noted to be hypertensive however I suspect this is secondary to his pain.  He has no chest pain or shortness of breath.  No evidence of hypertensive urgency.  CT scan shows 3 mm stone at the right UVJ with mild hydronephrosis.  No significant hydronephrosis.  Incidentally, there are gallstones noted on CT scan.  I personally evaluated these images and have notified the patient of this incidental finding.  On repeat abdominal exam he has no tenderness to palpation in the right upper quadrant.  Patient declined additional pain control initially but after p.o. trial stated his pain had increased some.  Patient given Percocet and Toradol with significant relief.  No vomiting here in the emergency department.  Be discharged home with conservative therapies and close follow-up with urology and primary care.  Patient and family state understanding and are in agreement with this plan.   Final Clinical Impressions(s) / ED Diagnoses   Final diagnoses:  Right flank pain  Renal colic on right side  Ureteral stone with hydronephrosis  Calculus of gallbladder without cholecystitis without obstruction    ED Discharge Orders        Ordered    oxyCODONE (ROXICODONE) 5 MG immediate release tablet  Every 6 hours PRN      03/28/18 0016    ondansetron (ZOFRAN ODT) 4 MG disintegrating tablet     03/28/18 0016    tamsulosin (FLOMAX) 0.4 MG CAPS capsule  2 times daily     03/28/18 0016       Addalyn Speedy, Gwenlyn Perking 03/28/18 Lou Cal, MD 03/30/18 1725

## 2018-03-28 NOTE — Discharge Instructions (Addendum)
1. Medications: zofran, oxycodone for breakthrough pain, flomax, usual home medications 2. Treatment: rest, drink plenty of fluids, advance diet slowly 3. Follow Up: Please followup with your primary doctor in 2 days and urology in 3-5 days for discussion of your diagnoses and further evaluation after today's visit; Please return to the ER for persistent vomiting, high fevers or worsening symptoms

## 2018-06-29 DIAGNOSIS — M79672 Pain in left foot: Secondary | ICD-10-CM | POA: Diagnosis not present

## 2018-06-29 DIAGNOSIS — Z23 Encounter for immunization: Secondary | ICD-10-CM | POA: Diagnosis not present

## 2018-06-29 DIAGNOSIS — K8020 Calculus of gallbladder without cholecystitis without obstruction: Secondary | ICD-10-CM | POA: Diagnosis not present

## 2018-06-29 DIAGNOSIS — Z85820 Personal history of malignant melanoma of skin: Secondary | ICD-10-CM | POA: Diagnosis not present

## 2018-06-29 DIAGNOSIS — Z125 Encounter for screening for malignant neoplasm of prostate: Secondary | ICD-10-CM | POA: Diagnosis not present

## 2018-06-29 DIAGNOSIS — Z1389 Encounter for screening for other disorder: Secondary | ICD-10-CM | POA: Diagnosis not present

## 2018-06-29 DIAGNOSIS — E7800 Pure hypercholesterolemia, unspecified: Secondary | ICD-10-CM | POA: Diagnosis not present

## 2018-06-29 DIAGNOSIS — Z0000 Encounter for general adult medical examination without abnormal findings: Secondary | ICD-10-CM | POA: Diagnosis not present

## 2018-06-29 DIAGNOSIS — M79671 Pain in right foot: Secondary | ICD-10-CM | POA: Diagnosis not present

## 2018-06-29 DIAGNOSIS — Z1159 Encounter for screening for other viral diseases: Secondary | ICD-10-CM | POA: Diagnosis not present

## 2018-07-05 ENCOUNTER — Encounter: Payer: Self-pay | Admitting: Neurology

## 2018-07-06 ENCOUNTER — Other Ambulatory Visit: Payer: Self-pay | Admitting: *Deleted

## 2018-07-06 DIAGNOSIS — R200 Anesthesia of skin: Secondary | ICD-10-CM

## 2018-08-26 ENCOUNTER — Ambulatory Visit (INDEPENDENT_AMBULATORY_CARE_PROVIDER_SITE_OTHER): Payer: Medicare Other | Admitting: Neurology

## 2018-08-26 DIAGNOSIS — R200 Anesthesia of skin: Secondary | ICD-10-CM

## 2018-08-26 NOTE — Procedures (Signed)
[facility]  [address], [address]  [place], [place] [address] Tel: [phone] Fax:  [phone] Test Date:  08/26/2018  Patient: Micheal [name] DOB: [birth year] Physician: Narda Amber, DO  Sex: Male Height: 5\' 10"  Ref Phys: Lavone Orn, MD  ID#: [id number] Temp: 38.0C Technician:    Patient Complaints: This is a 68 year old man referred for evaluation of bilateral feet pain and paresthesias.  NCV & EMG Findings: Electrodiagnostic testing of the right lower extremity and additional studies of the left shows:  1. Bilateral sural and superficial peroneal sensory responses are within normal limits.  2. Bilateral peroneal and tibial motor responses are within normal limits.  3. Bilateral tibial H reflex studies are within normal limits.  4. There is no evidence of active or chronic motor axonal changes affecting any of the tested muscles.  Motor unit configuration and recruitment pattern is within normal limits.  Impression: This is a normal study of the lower extremities.  In particular, there is no evidence of a sensorimotor polyneuropathy or lumbosacral radiculopathy.       ___________________________ Narda Amber, DO    Nerve Conduction Studies Anti Sensory Summary Table   Site NR Peak (ms) Norm Peak (ms) P-T Amp (V) Norm P-T Amp  Left Sup Peroneal Anti Sensory (Ant Lat Mall)  38C  12 cm    2.6 <4.6 11.5 >3  Right Sup Peroneal Anti Sensory (Ant Lat Mall)  38C  12 cm    2.2 <4.6 10.7 >3  Left Sural Anti Sensory (Lat Mall)  38C  Calf    3.3 <4.6 13.0 >3  Right Sural Anti Sensory (Lat Mall)  38C  Calf    3.2 <4.6 11.4 >3   Motor Summary Table   Site NR Onset (ms) Norm Onset (ms) O-P Amp (mV) Norm O-P Amp Site1 Site2 Delta-0 (ms) Dist (cm) Vel (m/s) Norm Vel (m/s)  Left Peroneal Motor (Ext Dig Brev)  38C  Ankle    2.9 <6.0 4.9 >2.5 B Fib Ankle 7.6 39.0 51 >40  B Fib    10.5  4.1  Poplt B Fib 1.5 8.0 53 >40  Poplt    12.0  4.0         Right  Peroneal Motor (Ext Dig Brev)  38C  Ankle    3.8 <6.0 3.1 >2.5 B Fib Ankle 7.8 37.0 47 >40  B Fib    11.6  2.4  Poplt B Fib 1.7 8.0 47 >40  Poplt    13.3  2.4         Left Tibial Motor (Abd Hall Brev)  38C  Ankle    4.0 <6.0 9.6 >4 Knee Ankle 9.3 43.0 46 >40  Knee    13.3  7.4         Right Tibial Motor (Abd Hall Brev)  38C  Ankle    4.0 <6.0 11.7 >4 Knee Ankle 8.8 40.0 45 >40  Knee    12.8  7.2          H Reflex Studies   NR H-Lat (ms) Lat Norm (ms) L-R H-Lat (ms)  Left Tibial (Gastroc)  38C     34.01 <35 0.00  Right Tibial (Gastroc)  38C     34.01 <35 0.00   EMG   Side Muscle Ins Act Fibs Psw Fasc Number Recrt Dur Dur. Amp Amp. Poly Poly. Comment  Right AntTibialis Nml Nml Nml Nml Nml Nml Nml Nml Nml Nml Nml Nml N/A  Right Gastroc Nml  Nml Nml Nml Nml Nml Nml Nml Nml Nml Nml Nml N/A  Right Flex Dig Long Nml Nml Nml Nml Nml Nml Nml Nml Nml Nml Nml Nml N/A  Right RectFemoris Nml Nml Nml Nml Nml Nml Nml Nml Nml Nml Nml Nml N/A  Right GluteusMed Nml Nml Nml Nml Nml Nml Nml Nml Nml Nml Nml Nml N/A  Left AntTibialis Nml Nml Nml Nml Nml Nml Nml Nml Nml Nml Nml Nml N/A  Left Gastroc Nml Nml Nml Nml Nml Nml Nml Nml Nml Nml Nml Nml N/A  Left Flex Dig Long Nml Nml Nml Nml Nml Nml Nml Nml Nml Nml Nml Nml N/A  Left RectFemoris Nml Nml Nml Nml Nml Nml Nml Nml Nml Nml Nml Nml N/A  Left GluteusMed Nml Nml Nml Nml Nml Nml Nml Nml Nml Nml Nml Nml N/A      Waveforms:

## 2018-08-27 DIAGNOSIS — H5203 Hypermetropia, bilateral: Secondary | ICD-10-CM | POA: Diagnosis not present

## 2018-08-27 DIAGNOSIS — H52223 Regular astigmatism, bilateral: Secondary | ICD-10-CM | POA: Diagnosis not present

## 2018-08-27 DIAGNOSIS — H524 Presbyopia: Secondary | ICD-10-CM | POA: Diagnosis not present

## 2018-08-27 DIAGNOSIS — H40023 Open angle with borderline findings, high risk, bilateral: Secondary | ICD-10-CM | POA: Diagnosis not present

## 2018-08-27 DIAGNOSIS — H43813 Vitreous degeneration, bilateral: Secondary | ICD-10-CM | POA: Diagnosis not present

## 2018-08-27 DIAGNOSIS — H2513 Age-related nuclear cataract, bilateral: Secondary | ICD-10-CM | POA: Diagnosis not present

## 2018-10-01 DIAGNOSIS — M79671 Pain in right foot: Secondary | ICD-10-CM | POA: Diagnosis not present

## 2018-10-01 DIAGNOSIS — M79672 Pain in left foot: Secondary | ICD-10-CM | POA: Diagnosis not present

## 2018-10-06 ENCOUNTER — Telehealth: Payer: Self-pay | Admitting: Neurology

## 2018-10-06 DIAGNOSIS — M25572 Pain in left ankle and joints of left foot: Secondary | ICD-10-CM | POA: Diagnosis not present

## 2018-10-06 DIAGNOSIS — M79675 Pain in left toe(s): Secondary | ICD-10-CM | POA: Diagnosis not present

## 2018-10-06 DIAGNOSIS — M25571 Pain in right ankle and joints of right foot: Secondary | ICD-10-CM | POA: Diagnosis not present

## 2018-10-06 NOTE — Telephone Encounter (Signed)
Results faxed again 

## 2018-10-06 NOTE — Telephone Encounter (Signed)
Dr. Was wanting EMG Results faxed again to [phone] because the other pages were messed up. Thanks!

## 2019-06-14 DIAGNOSIS — L821 Other seborrheic keratosis: Secondary | ICD-10-CM | POA: Diagnosis not present

## 2019-06-14 DIAGNOSIS — D229 Melanocytic nevi, unspecified: Secondary | ICD-10-CM | POA: Diagnosis not present

## 2019-06-14 DIAGNOSIS — Z85820 Personal history of malignant melanoma of skin: Secondary | ICD-10-CM | POA: Diagnosis not present

## 2019-07-05 DIAGNOSIS — Z0000 Encounter for general adult medical examination without abnormal findings: Secondary | ICD-10-CM | POA: Diagnosis not present

## 2019-07-05 DIAGNOSIS — E7800 Pure hypercholesterolemia, unspecified: Secondary | ICD-10-CM | POA: Diagnosis not present

## 2019-07-05 DIAGNOSIS — Z5181 Encounter for therapeutic drug level monitoring: Secondary | ICD-10-CM | POA: Diagnosis not present

## 2019-07-05 DIAGNOSIS — Z23 Encounter for immunization: Secondary | ICD-10-CM | POA: Diagnosis not present

## 2019-07-05 DIAGNOSIS — R079 Chest pain, unspecified: Secondary | ICD-10-CM | POA: Diagnosis not present

## 2019-07-05 DIAGNOSIS — Z1389 Encounter for screening for other disorder: Secondary | ICD-10-CM | POA: Diagnosis not present

## 2019-07-05 DIAGNOSIS — Z85820 Personal history of malignant melanoma of skin: Secondary | ICD-10-CM | POA: Diagnosis not present

## 2019-08-05 IMAGING — CT CT RENAL STONE PROTOCOL
2 of 4 series · 17 of 46 positions shown, 19 images · non-contrast
Comparison: 02/12/2006 CT of the abdomen and pelvis.

CLINICAL DATA: 67 y/o  M; right flank pain for 2 days.

EXAM:
CT ABDOMEN AND PELVIS WITHOUT CONTRAST
TECHNIQUE: Multidetector CT imaging of the abdomen and pelvis was performed
following the standard protocol without IV contrast.

[Series 3: renal stone 5.0 · axial · 0.97mm/px · z∈[+725,+1195]mm · 14 of 104 slices shown, 16 images]
[im 5/104  soft-tissue]
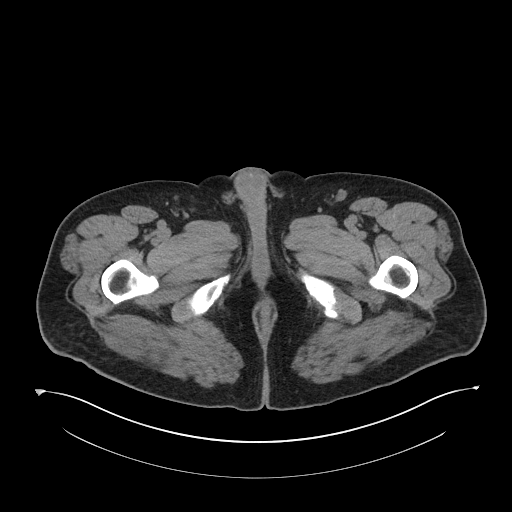
[im 5/104  bone]
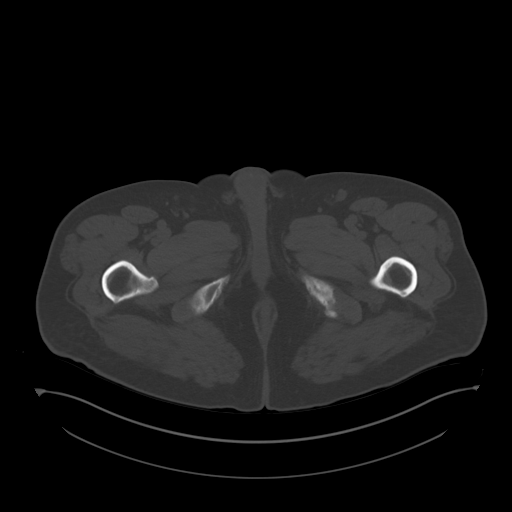
[im 13/104  soft-tissue]
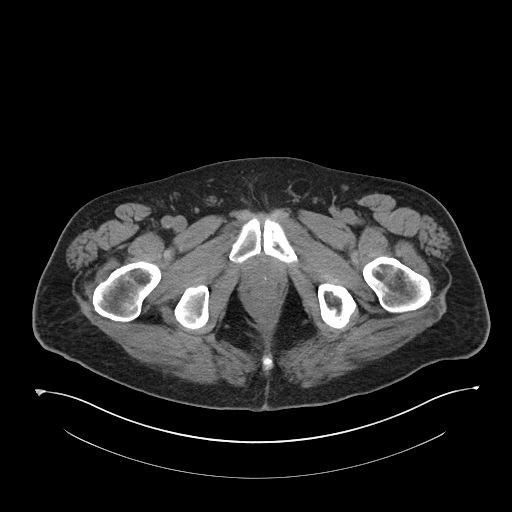
[im 22/104  soft-tissue]
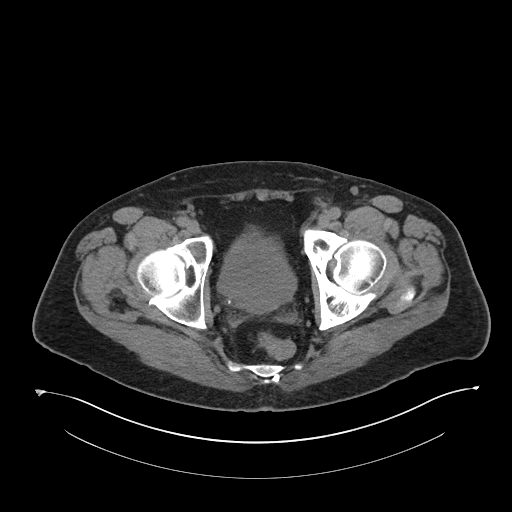
[im 26/104  soft-tissue]
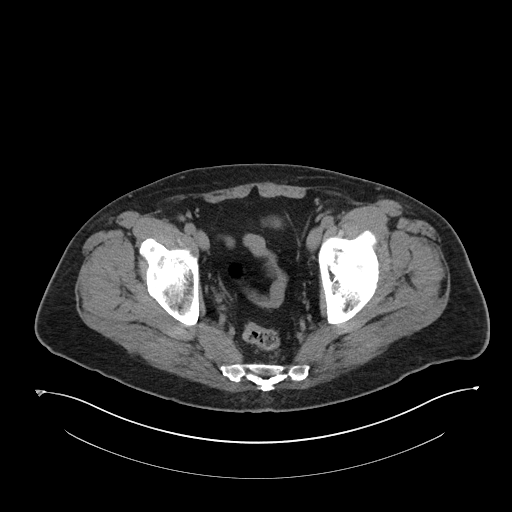
[im 35/104  soft-tissue]
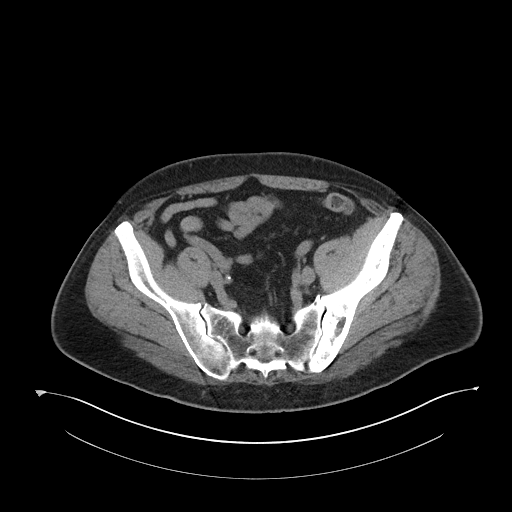
[im 43/104  soft-tissue]
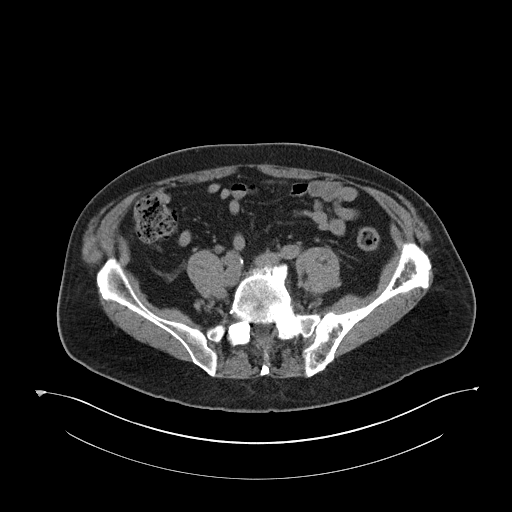
[im 48/104  soft-tissue]
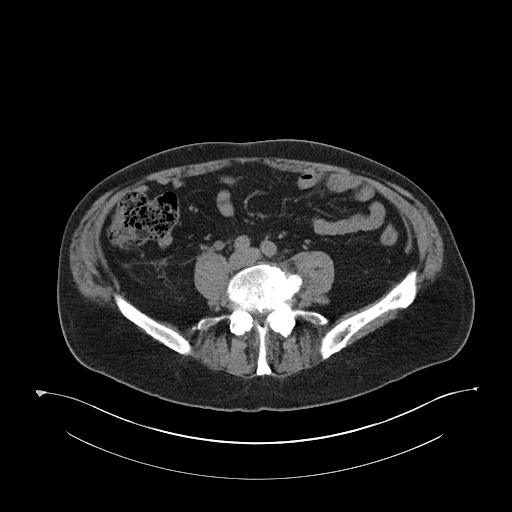
[im 56/104  soft-tissue]
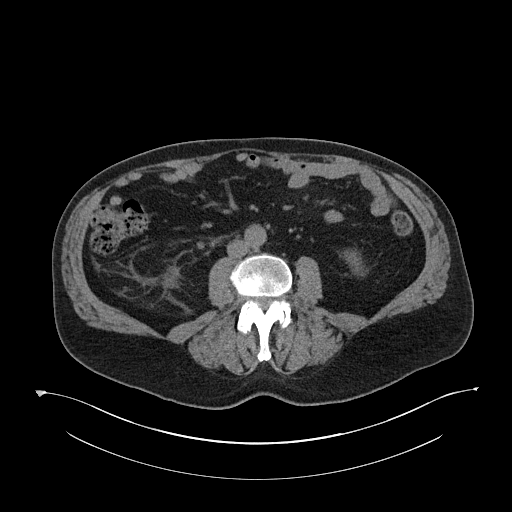
[im 61/104  soft-tissue]
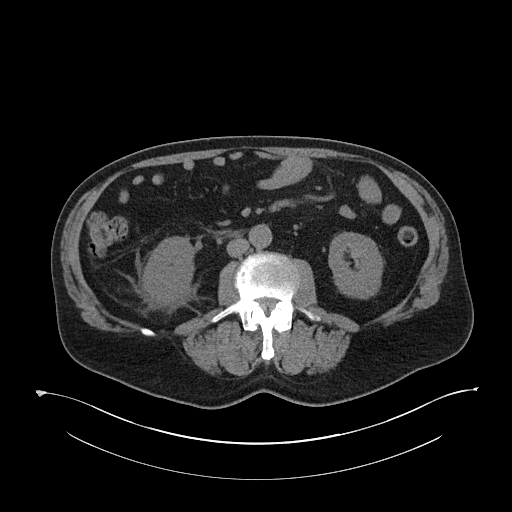
[im 61/104  bone]
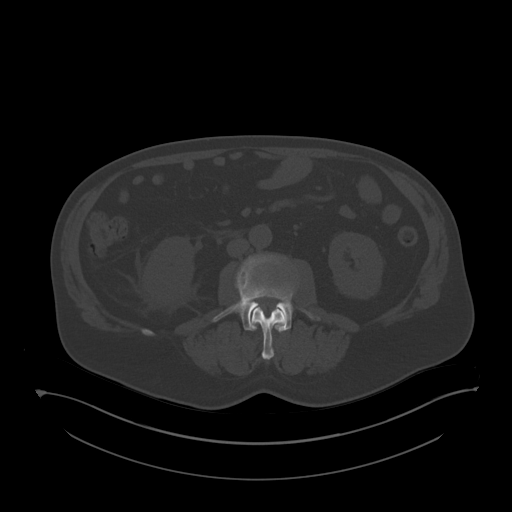
[im 69/104  soft-tissue]
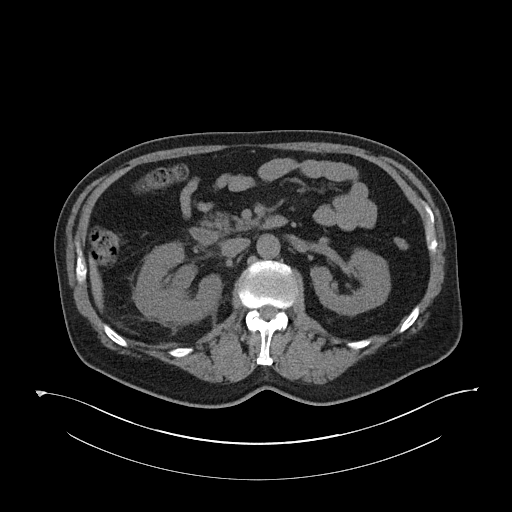
[im 78/104  soft-tissue]
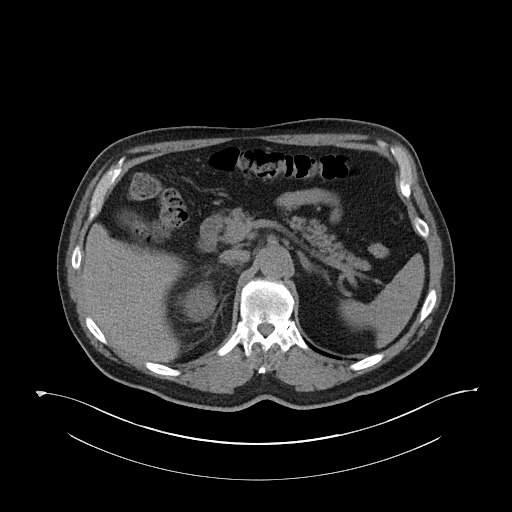
[im 82/104  soft-tissue]
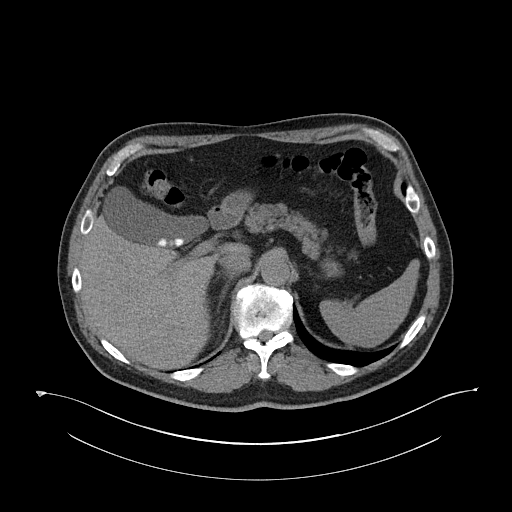
[im 91/104  soft-tissue]
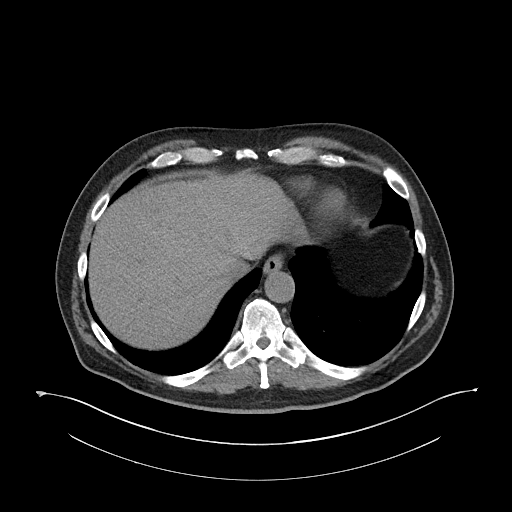
[im 99/104  soft-tissue]
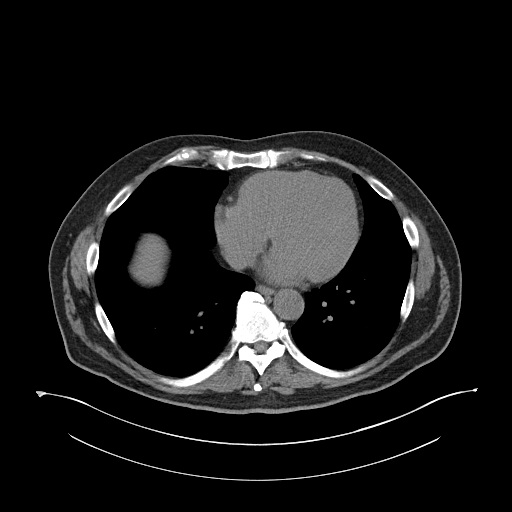

[Series 5: renal stone 3.0 cor · coronal · 1.00mm/px · 3 of 101 slices shown]
[im 34/101  soft-tissue]
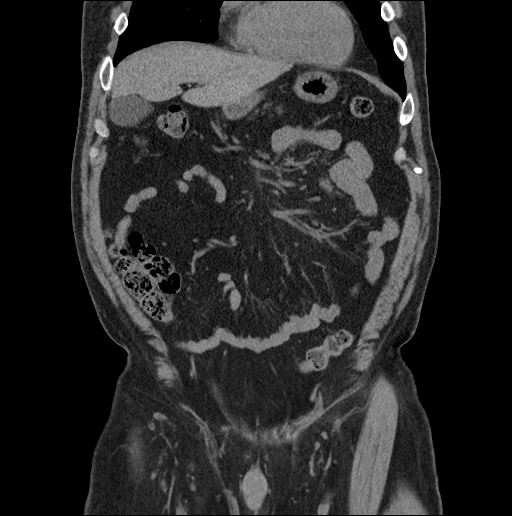
[im 45/101  soft-tissue]
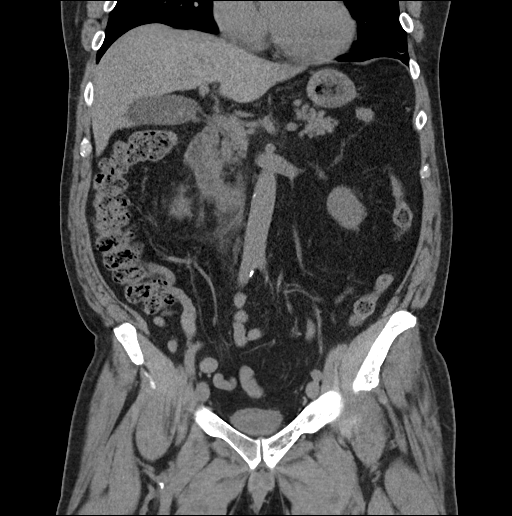
[im 56/101  soft-tissue]
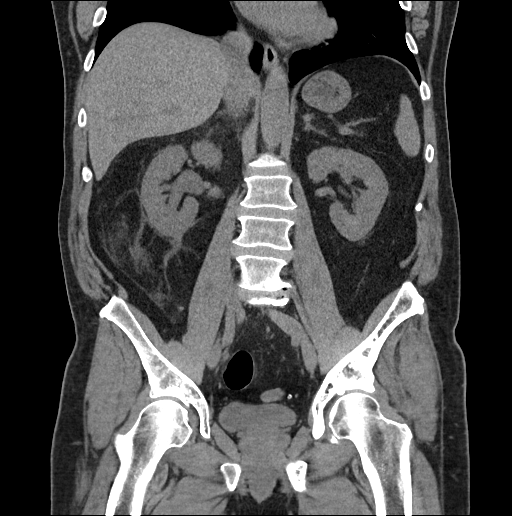

[17 of 46 positions shown; findings below may reference images not displayed]

FINDINGS: Lower chest: No acute abnormality.

Hepatobiliary: No focal liver abnormality is seen. Cholelithiasis.
No gallbladder wall thickening or biliary ductal dilatation.

Pancreas: Unremarkable. No pancreatic ductal dilatation or
surrounding inflammatory changes.

Spleen: Normal in size without focal abnormality.

Adrenals/Urinary Tract: Normal adrenal glands. Normal left kidney.
No left hydronephrosis. Mild right hydronephrosis and perinephric
stranding with 3 mm stone in the distal right ureter at the
ureterovesicular junction (series 3, image 83). Normal bladder.

Stomach/Bowel: Stomach is within normal limits. Appendix appears
normal. No evidence of bowel wall thickening, distention, or
inflammatory changes.

Vascular/Lymphatic: Aortic atherosclerosis. No enlarged abdominal or
pelvic lymph nodes.

Reproductive: Prostatic calcifications.

Other: No abdominal wall hernia or abnormality. No abdominopelvic
ascites.

Musculoskeletal: Diffuse mixed lucency and sclerosis throughout the
left ilium with cortical thickening compatible with Paget's disease.
Mild levocurvature of the lumbar spine and grade 1 L3-4
anterolisthesis. Lumbar spondylosis with multilevel disc and
prominent facet degenerative changes greatest at the L4-5 and L5-S1
levels. No acute fracture.
IMPRESSION: 1. Mild right hydronephrosis with a 3 mm stone in the distal right
ureter at the ureterovesicular junction.
2. Cholelithiasis.
3. Aortic atherosclerosis.

By: Zuleyka Sui M.D.

## 2019-09-19 ENCOUNTER — Other Ambulatory Visit: Payer: Self-pay

## 2019-09-19 ENCOUNTER — Ambulatory Visit: Payer: Medicare Other | Attending: Internal Medicine

## 2019-09-19 DIAGNOSIS — Z20822 Contact with and (suspected) exposure to covid-19: Secondary | ICD-10-CM

## 2019-09-20 ENCOUNTER — Telehealth: Payer: Self-pay

## 2019-09-20 LAB — NOVEL CORONAVIRUS, NAA: SARS-CoV-2, NAA: NOT DETECTED

## 2019-09-20 NOTE — Telephone Encounter (Signed)
Pt notified of negative COVID-19 results. Understanding verbalized.  Micheal [name]   

## 2019-11-03 DIAGNOSIS — E7800 Pure hypercholesterolemia, unspecified: Secondary | ICD-10-CM | POA: Diagnosis not present

## 2019-11-03 DIAGNOSIS — Z5181 Encounter for therapeutic drug level monitoring: Secondary | ICD-10-CM | POA: Diagnosis not present

## 2020-01-12 DIAGNOSIS — E7800 Pure hypercholesterolemia, unspecified: Secondary | ICD-10-CM | POA: Diagnosis not present

## 2020-06-15 DIAGNOSIS — H43813 Vitreous degeneration, bilateral: Secondary | ICD-10-CM | POA: Diagnosis not present

## 2020-06-15 DIAGNOSIS — H52223 Regular astigmatism, bilateral: Secondary | ICD-10-CM | POA: Diagnosis not present

## 2020-06-15 DIAGNOSIS — H5203 Hypermetropia, bilateral: Secondary | ICD-10-CM | POA: Diagnosis not present

## 2020-06-15 DIAGNOSIS — H524 Presbyopia: Secondary | ICD-10-CM | POA: Diagnosis not present

## 2020-06-15 DIAGNOSIS — H2513 Age-related nuclear cataract, bilateral: Secondary | ICD-10-CM | POA: Diagnosis not present

## 2020-06-15 DIAGNOSIS — H40023 Open angle with borderline findings, high risk, bilateral: Secondary | ICD-10-CM | POA: Diagnosis not present

## 2020-07-03 DIAGNOSIS — M79601 Pain in right arm: Secondary | ICD-10-CM | POA: Diagnosis not present

## 2020-07-03 DIAGNOSIS — M25561 Pain in right knee: Secondary | ICD-10-CM | POA: Diagnosis not present

## 2020-07-03 DIAGNOSIS — M1711 Unilateral primary osteoarthritis, right knee: Secondary | ICD-10-CM | POA: Diagnosis not present

## 2020-07-09 DIAGNOSIS — Z1389 Encounter for screening for other disorder: Secondary | ICD-10-CM | POA: Diagnosis not present

## 2020-07-09 DIAGNOSIS — Z85820 Personal history of malignant melanoma of skin: Secondary | ICD-10-CM | POA: Diagnosis not present

## 2020-07-09 DIAGNOSIS — Z125 Encounter for screening for malignant neoplasm of prostate: Secondary | ICD-10-CM | POA: Diagnosis not present

## 2020-07-09 DIAGNOSIS — Z800 Family history of malignant neoplasm of digestive organs: Secondary | ICD-10-CM | POA: Diagnosis not present

## 2020-07-09 DIAGNOSIS — H9319 Tinnitus, unspecified ear: Secondary | ICD-10-CM | POA: Diagnosis not present

## 2020-07-09 DIAGNOSIS — E7800 Pure hypercholesterolemia, unspecified: Secondary | ICD-10-CM | POA: Diagnosis not present

## 2020-07-09 DIAGNOSIS — Z0000 Encounter for general adult medical examination without abnormal findings: Secondary | ICD-10-CM | POA: Diagnosis not present

## 2020-08-30 DIAGNOSIS — E7800 Pure hypercholesterolemia, unspecified: Secondary | ICD-10-CM | POA: Diagnosis not present

## 2020-10-08 ENCOUNTER — Other Ambulatory Visit: Payer: Self-pay

## 2020-10-08 ENCOUNTER — Ambulatory Visit (INDEPENDENT_AMBULATORY_CARE_PROVIDER_SITE_OTHER): Payer: Medicare Other | Admitting: Dermatology

## 2020-10-08 ENCOUNTER — Encounter: Payer: Self-pay | Admitting: Dermatology

## 2020-10-08 DIAGNOSIS — L219 Seborrheic dermatitis, unspecified: Secondary | ICD-10-CM

## 2020-10-08 DIAGNOSIS — Z85828 Personal history of other malignant neoplasm of skin: Secondary | ICD-10-CM

## 2020-10-08 DIAGNOSIS — L918 Other hypertrophic disorders of the skin: Secondary | ICD-10-CM | POA: Diagnosis not present

## 2020-10-08 DIAGNOSIS — D1800 Hemangioma unspecified site: Secondary | ICD-10-CM

## 2020-10-08 DIAGNOSIS — Z86018 Personal history of other benign neoplasm: Secondary | ICD-10-CM | POA: Diagnosis not present

## 2020-10-08 DIAGNOSIS — L821 Other seborrheic keratosis: Secondary | ICD-10-CM

## 2020-10-08 DIAGNOSIS — Z85820 Personal history of malignant melanoma of skin: Secondary | ICD-10-CM

## 2020-10-08 DIAGNOSIS — Z87898 Personal history of other specified conditions: Secondary | ICD-10-CM

## 2020-10-08 MED ORDER — HALCINONIDE 0.1 % EX CREA: 1.0000 "application " | TOPICAL_CREAM | Freq: Every day | CUTANEOUS | 0 refills | Status: DC

## 2020-10-20 ENCOUNTER — Encounter: Payer: Self-pay | Admitting: Dermatology

## 2020-10-20 NOTE — Progress Notes (Signed)
   Follow-Up Visit   Subjective  Micheal [name] is a 70 y.o. male who presents for the following: Skin Problem (DRY SCALE LEFT EAR LOBE ITCHES NO BLEEDING COMES AND GOES, PATIENT STATED HE WELDS).  Rash Location: Left ear Duration:  Quality:  Associated Signs/Symptoms: Modifying Factors:  Severity:  Timing: Context: History of melanoma and nonmelanoma skin cancer  Objective  Well appearing patient in no apparent distress; mood and affect are within normal limits. Objective  Neck - Anterior: No sign repigmentation, no regional adenopathy.  Objective  Left Thigh - Anterior: No recurrence  Objective  Head - Anterior (Face): No recurrence  Objective  Right Axilla: Fleshy, skin-colored  pedunculated papules.    Objective  Right Upper Back: Flattopped brown textured 4 8 mm papules  Objective  Left Postauricular Sulcus: Pink inflamed scale, more likely dermatitis than sun damage.  Objective  Left Upper Back: Red raised smooth 3 mm papule   A full examination was performed including scalp, head, eyes, ears, nose, lips, neck, chest, axillae, abdomen, back, buttocks, bilateral upper extremities, bilateral lower extremities, hands, feet, fingers, toes, fingernails, and toenails. All findings within normal limits unless otherwise noted below.   Assessment & Plan    Personal history of malignant melanoma of skin Neck - Anterior  Yearly skin check  History of atypical nevus Left Thigh - Anterior  Recheck as needed  History of basal cell carcinoma (BCC) Head - Anterior (Face)  Recheck as needed  Skin tag Right Axilla  Okay to leave if stable  Seborrheic keratosis Right Upper Back  Observe okay to leave if stable  Seborrheic dermatitis Left Postauricular Sulcus  Will apply topical halocinonidedaily for just 2 weeks.  Avoid using this on the face.  Follow-up by phone in 2 weeks.  Halcinonide (HALOG) 0.1 % CREA - Left Postauricular Sulcus  Hemangioma,  unspecified site Left Upper Back  Okay to leave if stable     I, [name], MD, have reviewed all documentation for this visit.  The documentation on 10/20/20 for the exam, diagnosis, procedures, and orders are all accurate and complete.

## 2021-01-29 DIAGNOSIS — E7800 Pure hypercholesterolemia, unspecified: Secondary | ICD-10-CM | POA: Diagnosis not present

## 2021-01-29 DIAGNOSIS — I872 Venous insufficiency (chronic) (peripheral): Secondary | ICD-10-CM | POA: Diagnosis not present

## 2021-01-29 DIAGNOSIS — M6740 Ganglion, unspecified site: Secondary | ICD-10-CM | POA: Diagnosis not present

## 2021-01-29 DIAGNOSIS — R202 Paresthesia of skin: Secondary | ICD-10-CM | POA: Diagnosis not present

## 2021-03-12 ENCOUNTER — Ambulatory Visit: Payer: Medicare Other | Admitting: Dermatology

## 2021-06-28 DIAGNOSIS — H40023 Open angle with borderline findings, high risk, bilateral: Secondary | ICD-10-CM | POA: Diagnosis not present

## 2021-06-28 DIAGNOSIS — H43813 Vitreous degeneration, bilateral: Secondary | ICD-10-CM | POA: Diagnosis not present

## 2021-06-28 DIAGNOSIS — H2513 Age-related nuclear cataract, bilateral: Secondary | ICD-10-CM | POA: Diagnosis not present

## 2021-07-11 DIAGNOSIS — M79644 Pain in right finger(s): Secondary | ICD-10-CM | POA: Diagnosis not present

## 2021-07-23 DIAGNOSIS — M170 Bilateral primary osteoarthritis of knee: Secondary | ICD-10-CM | POA: Diagnosis not present

## 2021-07-31 DIAGNOSIS — M71341 Other bursal cyst, right hand: Secondary | ICD-10-CM | POA: Diagnosis not present

## 2021-07-31 DIAGNOSIS — M25741 Osteophyte, right hand: Secondary | ICD-10-CM | POA: Diagnosis not present

## 2021-09-11 DIAGNOSIS — Z23 Encounter for immunization: Secondary | ICD-10-CM | POA: Diagnosis not present

## 2021-09-11 DIAGNOSIS — Z1389 Encounter for screening for other disorder: Secondary | ICD-10-CM | POA: Diagnosis not present

## 2021-09-11 DIAGNOSIS — Z5181 Encounter for therapeutic drug level monitoring: Secondary | ICD-10-CM | POA: Diagnosis not present

## 2021-09-11 DIAGNOSIS — E7800 Pure hypercholesterolemia, unspecified: Secondary | ICD-10-CM | POA: Diagnosis not present

## 2021-09-11 DIAGNOSIS — Z0000 Encounter for general adult medical examination without abnormal findings: Secondary | ICD-10-CM | POA: Diagnosis not present

## 2021-10-08 ENCOUNTER — Other Ambulatory Visit: Payer: Self-pay

## 2021-10-08 ENCOUNTER — Ambulatory Visit (INDEPENDENT_AMBULATORY_CARE_PROVIDER_SITE_OTHER): Payer: Medicare Other | Admitting: Dermatology

## 2021-10-08 ENCOUNTER — Encounter: Payer: Self-pay | Admitting: Dermatology

## 2021-10-08 DIAGNOSIS — L918 Other hypertrophic disorders of the skin: Secondary | ICD-10-CM

## 2021-10-08 DIAGNOSIS — C44612 Basal cell carcinoma of skin of right upper limb, including shoulder: Secondary | ICD-10-CM

## 2021-10-08 DIAGNOSIS — D485 Neoplasm of uncertain behavior of skin: Secondary | ICD-10-CM

## 2021-10-08 DIAGNOSIS — Z1283 Encounter for screening for malignant neoplasm of skin: Secondary | ICD-10-CM | POA: Diagnosis not present

## 2021-10-08 DIAGNOSIS — L821 Other seborrheic keratosis: Secondary | ICD-10-CM

## 2021-10-08 DIAGNOSIS — D170 Benign lipomatous neoplasm of skin and subcutaneous tissue of head, face and neck: Secondary | ICD-10-CM

## 2021-10-08 DIAGNOSIS — L738 Other specified follicular disorders: Secondary | ICD-10-CM | POA: Diagnosis not present

## 2021-10-08 DIAGNOSIS — D1720 Benign lipomatous neoplasm of skin and subcutaneous tissue of unspecified limb: Secondary | ICD-10-CM

## 2021-10-08 NOTE — Patient Instructions (Signed)

## 2021-10-25 ENCOUNTER — Encounter: Payer: Self-pay | Admitting: Dermatology

## 2021-10-25 NOTE — Progress Notes (Signed)
° °  Follow-Up Visit   Subjective  Micheal [name] is a 71 y.o. male who presents for the following: Annual Exam (Full body exam. No concerns. Personal history of bcc and melanoma.).  General skin examination, several spots to check Location:  Duration:  Quality:  Associated Signs/Symptoms: Modifying Factors:  Severity:  Timing: Context:   Objective  Well appearing patient in no apparent distress; mood and affect are within normal limits. No new or recurrent atypical pigmented spots/melanoma, 1 possible new nonmelanoma skin cancer right shoulder will be biopsied and treated.   Neck - Posterior Doughy 2 cm subcutaneous mobile noninflamed nodule  Left Temple, R Clavicle, Torso - Posterior (Back) Multiple 4 to 6 mm flattopped textured brown papules with compatible dermoscopy  Right Axilla Pedunculated flesh-colored 3 mm papule  Right Shoulder - Anterior Waxy pink 8 mm slightly raised papule compatible with superficial BCC          A full examination was performed including scalp, head, eyes, ears, nose, lips, neck, chest, axillae, abdomen, back, buttocks, bilateral upper extremities, bilateral lower extremities, hands, feet, fingers, toes, fingernails, and toenails. All findings within normal limits unless otherwise noted below.   Assessment & Plan    Lipoma of upper extremity, unspecified laterality Neck - Posterior  Patient can decide if he prefers elective excision; will schedule I hour if so desired.  Screening exam for skin cancer  Annual skin examination, encouraged to self examine twice annually.  Seborrheic keratosis (3) R Clavicle; Torso - Posterior (Back); Left Temple  Recheck as needed for change  Sebaceous hyperplasia of face Head - Anterior (Face)  Skin tag Right Axilla  No intervention necessary  Basal cell carcinoma of skin of right upper limb, including shoulder Right Shoulder - Anterior  Skin / nail biopsy Type of biopsy: tangential    Informed consent: discussed and consent obtained   Timeout: patient name, date of birth, surgical site, and procedure verified   Anesthesia: the lesion was anesthetized in a standard fashion   Anesthetic:  1% lidocaine w/ epinephrine 1-100,000 local infiltration Instrument used: flexible razor blade   Hemostasis achieved with: ferric subsulfate and electrodesiccation   Outcome: patient tolerated procedure well   Post-procedure details: wound care instructions given    Destruction of lesion Complexity: simple   Destruction method: electrodesiccation and curettage   Informed consent: discussed and consent obtained   Timeout:  patient name, date of birth, surgical site, and procedure verified Anesthesia: the lesion was anesthetized in a standard fashion   Anesthetic:  1% lidocaine w/ epinephrine 1-100,000 local infiltration Curettage performed in three different directions: Yes   Curettage cycles:  3 Lesion length (cm):  1.5 Lesion width (cm):  1.5 Margin per side (cm):  0 Final wound size (cm):  1.5 Hemostasis achieved with:  ferric subsulfate and electrodesiccation Outcome: patient tolerated procedure well with no complications   Post-procedure details: wound care instructions given    After shave biopsy the base was curetted and cauterized   Other Procedures Placed This Encounter Surgical pathology     I, [name], MD, have reviewed all documentation for this visit.  The documentation on 10/25/21 for the exam, diagnosis, procedures, and orders are all accurate and complete.

## 2021-11-07 ENCOUNTER — Other Ambulatory Visit: Payer: Self-pay | Admitting: Internal Medicine

## 2021-11-07 DIAGNOSIS — R1013 Epigastric pain: Secondary | ICD-10-CM | POA: Diagnosis not present

## 2021-11-08 ENCOUNTER — Ambulatory Visit
Admission: RE | Admit: 2021-11-08 | Discharge: 2021-11-08 | Disposition: A | Discharge status: Home / Self Care | Payer: Medicare Other | Source: Ambulatory Visit | Attending: Internal Medicine | Admitting: Internal Medicine

## 2021-11-08 DIAGNOSIS — R1013 Epigastric pain: Secondary | ICD-10-CM | POA: Diagnosis not present

## 2021-11-08 DIAGNOSIS — K8020 Calculus of gallbladder without cholecystitis without obstruction: Secondary | ICD-10-CM | POA: Diagnosis not present

## 2021-12-02 DIAGNOSIS — Z7185 Encounter for immunization safety counseling: Secondary | ICD-10-CM | POA: Diagnosis not present

## 2021-12-02 DIAGNOSIS — Z7184 Encounter for health counseling related to travel: Secondary | ICD-10-CM | POA: Diagnosis not present

## 2021-12-04 DIAGNOSIS — Z03818 Encounter for observation for suspected exposure to other biological agents ruled out: Secondary | ICD-10-CM | POA: Diagnosis not present

## 2021-12-04 DIAGNOSIS — Z1152 Encounter for screening for COVID-19: Secondary | ICD-10-CM | POA: Diagnosis not present

## 2021-12-31 DIAGNOSIS — M5412 Radiculopathy, cervical region: Secondary | ICD-10-CM | POA: Diagnosis not present

## 2021-12-31 DIAGNOSIS — H40023 Open angle with borderline findings, high risk, bilateral: Secondary | ICD-10-CM | POA: Diagnosis not present

## 2021-12-31 DIAGNOSIS — H2513 Age-related nuclear cataract, bilateral: Secondary | ICD-10-CM | POA: Diagnosis not present

## 2021-12-31 DIAGNOSIS — H524 Presbyopia: Secondary | ICD-10-CM | POA: Diagnosis not present

## 2021-12-31 DIAGNOSIS — H52223 Regular astigmatism, bilateral: Secondary | ICD-10-CM | POA: Diagnosis not present

## 2021-12-31 DIAGNOSIS — H5203 Hypermetropia, bilateral: Secondary | ICD-10-CM | POA: Diagnosis not present

## 2021-12-31 DIAGNOSIS — M5416 Radiculopathy, lumbar region: Secondary | ICD-10-CM | POA: Diagnosis not present

## 2021-12-31 DIAGNOSIS — M542 Cervicalgia: Secondary | ICD-10-CM | POA: Diagnosis not present

## 2021-12-31 DIAGNOSIS — H43813 Vitreous degeneration, bilateral: Secondary | ICD-10-CM | POA: Diagnosis not present

## 2022-01-06 DIAGNOSIS — M542 Cervicalgia: Secondary | ICD-10-CM | POA: Diagnosis not present

## 2022-01-06 DIAGNOSIS — M545 Low back pain, unspecified: Secondary | ICD-10-CM | POA: Diagnosis not present

## 2022-01-14 DIAGNOSIS — M5412 Radiculopathy, cervical region: Secondary | ICD-10-CM | POA: Diagnosis not present

## 2022-02-10 DIAGNOSIS — M48062 Spinal stenosis, lumbar region with neurogenic claudication: Secondary | ICD-10-CM | POA: Diagnosis not present

## 2022-03-03 DIAGNOSIS — M5412 Radiculopathy, cervical region: Secondary | ICD-10-CM | POA: Diagnosis not present

## 2022-03-03 DIAGNOSIS — M4802 Spinal stenosis, cervical region: Secondary | ICD-10-CM | POA: Diagnosis not present

## 2022-03-03 DIAGNOSIS — M5459 Other low back pain: Secondary | ICD-10-CM | POA: Diagnosis not present

## 2022-03-03 DIAGNOSIS — M542 Cervicalgia: Secondary | ICD-10-CM | POA: Diagnosis not present

## 2022-03-06 DIAGNOSIS — M4802 Spinal stenosis, cervical region: Secondary | ICD-10-CM | POA: Diagnosis not present

## 2022-03-06 DIAGNOSIS — M5412 Radiculopathy, cervical region: Secondary | ICD-10-CM | POA: Diagnosis not present

## 2022-03-06 DIAGNOSIS — M5459 Other low back pain: Secondary | ICD-10-CM | POA: Diagnosis not present

## 2022-03-06 DIAGNOSIS — M542 Cervicalgia: Secondary | ICD-10-CM | POA: Diagnosis not present

## 2022-03-11 DIAGNOSIS — M5459 Other low back pain: Secondary | ICD-10-CM | POA: Diagnosis not present

## 2022-03-11 DIAGNOSIS — M4802 Spinal stenosis, cervical region: Secondary | ICD-10-CM | POA: Diagnosis not present

## 2022-03-11 DIAGNOSIS — M5412 Radiculopathy, cervical region: Secondary | ICD-10-CM | POA: Diagnosis not present

## 2022-03-11 DIAGNOSIS — M542 Cervicalgia: Secondary | ICD-10-CM | POA: Diagnosis not present

## 2022-03-17 DIAGNOSIS — M5412 Radiculopathy, cervical region: Secondary | ICD-10-CM | POA: Diagnosis not present

## 2022-03-17 DIAGNOSIS — M5459 Other low back pain: Secondary | ICD-10-CM | POA: Diagnosis not present

## 2022-03-17 DIAGNOSIS — M542 Cervicalgia: Secondary | ICD-10-CM | POA: Diagnosis not present

## 2022-03-17 DIAGNOSIS — M4802 Spinal stenosis, cervical region: Secondary | ICD-10-CM | POA: Diagnosis not present

## 2022-03-19 DIAGNOSIS — M542 Cervicalgia: Secondary | ICD-10-CM | POA: Diagnosis not present

## 2022-03-19 DIAGNOSIS — M5459 Other low back pain: Secondary | ICD-10-CM | POA: Diagnosis not present

## 2022-03-19 DIAGNOSIS — M4802 Spinal stenosis, cervical region: Secondary | ICD-10-CM | POA: Diagnosis not present

## 2022-03-19 DIAGNOSIS — M5412 Radiculopathy, cervical region: Secondary | ICD-10-CM | POA: Diagnosis not present

## 2022-03-25 DIAGNOSIS — M4802 Spinal stenosis, cervical region: Secondary | ICD-10-CM | POA: Diagnosis not present

## 2022-03-25 DIAGNOSIS — M5459 Other low back pain: Secondary | ICD-10-CM | POA: Diagnosis not present

## 2022-03-25 DIAGNOSIS — M542 Cervicalgia: Secondary | ICD-10-CM | POA: Diagnosis not present

## 2022-03-25 DIAGNOSIS — M5412 Radiculopathy, cervical region: Secondary | ICD-10-CM | POA: Diagnosis not present

## 2022-04-03 DIAGNOSIS — M4802 Spinal stenosis, cervical region: Secondary | ICD-10-CM | POA: Diagnosis not present

## 2022-04-03 DIAGNOSIS — M5412 Radiculopathy, cervical region: Secondary | ICD-10-CM | POA: Diagnosis not present

## 2022-04-03 DIAGNOSIS — M542 Cervicalgia: Secondary | ICD-10-CM | POA: Diagnosis not present

## 2022-04-03 DIAGNOSIS — M5459 Other low back pain: Secondary | ICD-10-CM | POA: Diagnosis not present

## 2022-04-10 DIAGNOSIS — M5459 Other low back pain: Secondary | ICD-10-CM | POA: Diagnosis not present

## 2022-04-10 DIAGNOSIS — M4802 Spinal stenosis, cervical region: Secondary | ICD-10-CM | POA: Diagnosis not present

## 2022-04-10 DIAGNOSIS — M542 Cervicalgia: Secondary | ICD-10-CM | POA: Diagnosis not present

## 2022-04-10 DIAGNOSIS — M5412 Radiculopathy, cervical region: Secondary | ICD-10-CM | POA: Diagnosis not present

## 2022-04-16 DIAGNOSIS — M542 Cervicalgia: Secondary | ICD-10-CM | POA: Diagnosis not present

## 2022-04-16 DIAGNOSIS — M4802 Spinal stenosis, cervical region: Secondary | ICD-10-CM | POA: Diagnosis not present

## 2022-04-16 DIAGNOSIS — M5459 Other low back pain: Secondary | ICD-10-CM | POA: Diagnosis not present

## 2022-04-16 DIAGNOSIS — M5412 Radiculopathy, cervical region: Secondary | ICD-10-CM | POA: Diagnosis not present

## 2022-04-21 DIAGNOSIS — M5459 Other low back pain: Secondary | ICD-10-CM | POA: Diagnosis not present

## 2022-04-21 DIAGNOSIS — M542 Cervicalgia: Secondary | ICD-10-CM | POA: Diagnosis not present

## 2022-04-21 DIAGNOSIS — M5412 Radiculopathy, cervical region: Secondary | ICD-10-CM | POA: Diagnosis not present

## 2022-04-21 DIAGNOSIS — M4802 Spinal stenosis, cervical region: Secondary | ICD-10-CM | POA: Diagnosis not present

## 2022-05-06 DIAGNOSIS — M48062 Spinal stenosis, lumbar region with neurogenic claudication: Secondary | ICD-10-CM | POA: Diagnosis not present

## 2022-05-31 DIAGNOSIS — J0100 Acute maxillary sinusitis, unspecified: Secondary | ICD-10-CM | POA: Diagnosis not present

## 2022-05-31 DIAGNOSIS — B9689 Other specified bacterial agents as the cause of diseases classified elsewhere: Secondary | ICD-10-CM | POA: Diagnosis not present

## 2022-05-31 DIAGNOSIS — R0981 Nasal congestion: Secondary | ICD-10-CM | POA: Diagnosis not present

## 2022-05-31 DIAGNOSIS — J988 Other specified respiratory disorders: Secondary | ICD-10-CM | POA: Diagnosis not present

## 2022-06-10 DIAGNOSIS — M48062 Spinal stenosis, lumbar region with neurogenic claudication: Secondary | ICD-10-CM | POA: Diagnosis not present

## 2022-07-01 DIAGNOSIS — H40023 Open angle with borderline findings, high risk, bilateral: Secondary | ICD-10-CM | POA: Diagnosis not present

## 2022-07-01 DIAGNOSIS — H43813 Vitreous degeneration, bilateral: Secondary | ICD-10-CM | POA: Diagnosis not present

## 2022-07-01 DIAGNOSIS — H2513 Age-related nuclear cataract, bilateral: Secondary | ICD-10-CM | POA: Diagnosis not present

## 2022-07-02 DIAGNOSIS — E7800 Pure hypercholesterolemia, unspecified: Secondary | ICD-10-CM | POA: Diagnosis not present

## 2022-07-02 DIAGNOSIS — Z79899 Other long term (current) drug therapy: Secondary | ICD-10-CM | POA: Diagnosis not present

## 2022-07-23 DIAGNOSIS — L918 Other hypertrophic disorders of the skin: Secondary | ICD-10-CM | POA: Diagnosis not present

## 2022-07-23 DIAGNOSIS — D1801 Hemangioma of skin and subcutaneous tissue: Secondary | ICD-10-CM | POA: Diagnosis not present

## 2022-07-23 DIAGNOSIS — D225 Melanocytic nevi of trunk: Secondary | ICD-10-CM | POA: Diagnosis not present

## 2022-07-23 DIAGNOSIS — L821 Other seborrheic keratosis: Secondary | ICD-10-CM | POA: Diagnosis not present

## 2022-07-23 DIAGNOSIS — L738 Other specified follicular disorders: Secondary | ICD-10-CM | POA: Diagnosis not present

## 2022-07-23 DIAGNOSIS — Z85820 Personal history of malignant melanoma of skin: Secondary | ICD-10-CM | POA: Diagnosis not present

## 2022-09-15 DIAGNOSIS — M9903 Segmental and somatic dysfunction of lumbar region: Secondary | ICD-10-CM | POA: Diagnosis not present

## 2022-09-15 DIAGNOSIS — M9905 Segmental and somatic dysfunction of pelvic region: Secondary | ICD-10-CM | POA: Diagnosis not present

## 2022-09-15 DIAGNOSIS — M5136 Other intervertebral disc degeneration, lumbar region: Secondary | ICD-10-CM | POA: Diagnosis not present

## 2022-09-16 DIAGNOSIS — M9905 Segmental and somatic dysfunction of pelvic region: Secondary | ICD-10-CM | POA: Diagnosis not present

## 2022-09-16 DIAGNOSIS — M5136 Other intervertebral disc degeneration, lumbar region: Secondary | ICD-10-CM | POA: Diagnosis not present

## 2022-09-16 DIAGNOSIS — M9903 Segmental and somatic dysfunction of lumbar region: Secondary | ICD-10-CM | POA: Diagnosis not present

## 2022-09-22 DIAGNOSIS — M9905 Segmental and somatic dysfunction of pelvic region: Secondary | ICD-10-CM | POA: Diagnosis not present

## 2022-09-22 DIAGNOSIS — M9903 Segmental and somatic dysfunction of lumbar region: Secondary | ICD-10-CM | POA: Diagnosis not present

## 2022-09-22 DIAGNOSIS — M5136 Other intervertebral disc degeneration, lumbar region: Secondary | ICD-10-CM | POA: Diagnosis not present

## 2022-09-24 DIAGNOSIS — M5136 Other intervertebral disc degeneration, lumbar region: Secondary | ICD-10-CM | POA: Diagnosis not present

## 2022-09-24 DIAGNOSIS — M9903 Segmental and somatic dysfunction of lumbar region: Secondary | ICD-10-CM | POA: Diagnosis not present

## 2022-09-24 DIAGNOSIS — M9905 Segmental and somatic dysfunction of pelvic region: Secondary | ICD-10-CM | POA: Diagnosis not present

## 2022-09-25 DIAGNOSIS — M9903 Segmental and somatic dysfunction of lumbar region: Secondary | ICD-10-CM | POA: Diagnosis not present

## 2022-09-25 DIAGNOSIS — M9905 Segmental and somatic dysfunction of pelvic region: Secondary | ICD-10-CM | POA: Diagnosis not present

## 2022-09-25 DIAGNOSIS — M5136 Other intervertebral disc degeneration, lumbar region: Secondary | ICD-10-CM | POA: Diagnosis not present

## 2022-10-01 DIAGNOSIS — M9903 Segmental and somatic dysfunction of lumbar region: Secondary | ICD-10-CM | POA: Diagnosis not present

## 2022-10-01 DIAGNOSIS — M5136 Other intervertebral disc degeneration, lumbar region: Secondary | ICD-10-CM | POA: Diagnosis not present

## 2022-10-01 DIAGNOSIS — M9905 Segmental and somatic dysfunction of pelvic region: Secondary | ICD-10-CM | POA: Diagnosis not present

## 2022-10-02 DIAGNOSIS — M9903 Segmental and somatic dysfunction of lumbar region: Secondary | ICD-10-CM | POA: Diagnosis not present

## 2022-10-02 DIAGNOSIS — M5136 Other intervertebral disc degeneration, lumbar region: Secondary | ICD-10-CM | POA: Diagnosis not present

## 2022-10-02 DIAGNOSIS — M9905 Segmental and somatic dysfunction of pelvic region: Secondary | ICD-10-CM | POA: Diagnosis not present

## 2022-10-06 ENCOUNTER — Encounter (HOSPITAL_COMMUNITY): Payer: Self-pay

## 2022-10-06 ENCOUNTER — Ambulatory Visit (HOSPITAL_COMMUNITY)
Admission: RE | Admit: 2022-10-06 | Discharge: 2022-10-06 | Disposition: A | Discharge status: Home / Self Care | Payer: Medicare Other | Source: Ambulatory Visit | Attending: Internal Medicine | Admitting: Internal Medicine

## 2022-10-06 ENCOUNTER — Ambulatory Visit (INDEPENDENT_AMBULATORY_CARE_PROVIDER_SITE_OTHER): Payer: Medicare Other

## 2022-10-06 VITALS — BP 121/76 | HR 72 | Temp 98.3°F | Resp 12

## 2022-10-06 DIAGNOSIS — M1712 Unilateral primary osteoarthritis, left knee: Secondary | ICD-10-CM

## 2022-10-06 DIAGNOSIS — M25562 Pain in left knee: Secondary | ICD-10-CM

## 2022-10-06 DIAGNOSIS — S8992XA Unspecified injury of left lower leg, initial encounter: Secondary | ICD-10-CM | POA: Diagnosis not present

## 2022-10-06 DIAGNOSIS — M25462 Effusion, left knee: Secondary | ICD-10-CM | POA: Diagnosis not present

## 2022-10-06 MED ORDER — METHYLPREDNISOLONE 4 MG PO TBPK: ORAL_TABLET | ORAL | 0 refills | Status: AC

## 2022-10-06 MED ORDER — KETOROLAC TROMETHAMINE 30 MG/ML IJ SOLN
15.0000 mg | Freq: Once | INTRAMUSCULAR | Status: AC
  Administered 2022-10-06: 15 mg via INTRAMUSCULAR

## 2022-10-06 MED ORDER — KETOROLAC TROMETHAMINE 30 MG/ML IJ SOLN
INTRAMUSCULAR | Status: AC
  Filled 2022-10-06: qty 1

## 2022-10-06 MED ORDER — ACETAMINOPHEN 325 MG PO TABS
975.0000 mg | ORAL_TABLET | Freq: Once | ORAL | Status: AC
  Administered 2022-10-06: 975 mg via ORAL

## 2022-10-06 MED ORDER — ACETAMINOPHEN 325 MG PO TABS
ORAL_TABLET | ORAL | Status: AC
  Filled 2022-10-06: qty 3

## 2022-10-06 NOTE — ED Provider Notes (Signed)
[name]    CSN: [id number] Arrival date & time: 10/06/22  1419      History   Chief Complaint Chief Complaint  Patient presents with   Knee Injury    Left knee pain, swollen - Entered by patient    HPI Micheal [name] is a 72 y.o. male.   Patient presents to urgent care for evaluation of left-sided knee pain that started 2 days ago.  Patient states he was laying down working on his car when he turned to roll over sideways and heard a pop to the medial aspect of the left knee followed by significant pain immediately to the medial aspect of the left knee.  Denies recent trauma/blunt injury to the left knee.  The area became swollen as soon as he heard the pop to the left knee.  He has been able to bear weight to the left lower extremity, however he is experiencing significant pain to the left knee with ambulation.  Denies numbness and tingling distal to injury, color change distal to injury, ecchymosis, and posterior knee pain.  Denies previous history of injury/surgical procedure to the left knee.  He has been to an 400 mg of ibuprofen once daily at bedtime to help with pain without much relief.  He sees orthopedics for evaluation of his right knee and states that he made an appointment to see them in 3 days on Thursday, October 09, 2022.  History of MCL tear to the right knee.  States pain he experienced 2 days ago with similar, however worse when compared to pain experience when he tore his MCL to the right knee.     Past Medical History:  Diagnosis Date   Atypical mole 11/09/2012   mod-left thigh sup   Atypical mole 11/09/2012   mild-left inferior thigh    Basal cell carcinoma 11/28/2008   right sclap- (CX35FU)   Basal cell carcinoma 12/02/2016   sup/nod- behind right ear (CX35FU)   Melanoma ([place]) 05/07/2005   in situ- "V" of neck (MOHS)   Renal disorder     Patient Active Problem List   Diagnosis Date Noted   ARTHRALGIA UNSPECIFIED SITE 06/26/2008   EDEMA  06/26/2008   ONYCHOMYCOSIS 01/10/2008   OBESITY, MILD 01/10/2008   CHOLELITHIASIS, ASYMPTOMATIC 01/10/2008   DEGENERATIVE JOINT DISEASE 01/10/2008   LOW BACK PAIN, MILD 01/10/2008    History reviewed. No pertinent surgical history.     Home Medications    Prior to Admission medications   Medication Sig Start Date End Date Taking? Authorizing Provider  methylPREDNISolone (MEDROL DOSEPAK) 4 MG TBPK tablet Take as directed with food. 10/06/22  Yes Talbot Grumbling, FNP  rosuvastatin (CRESTOR) 5 MG tablet Take 5 mg by mouth daily. 09/06/21  Yes [provider]  cholecalciferol (VITAMIN D3) 25 MCG (1000 UNIT) tablet 1 tablet    [provider]  Coenzyme Q10 (CO Q 10) 100 MG CAPS See admin instructions.    [provider]  meloxicam (MOBIC) 15 MG tablet 1 tablet 07/30/21   [provider]  oxyCODONE (ROXICODONE) 5 MG immediate release tablet Take 1 tablet (5 mg total) by mouth every 6 (six) hours as needed for severe pain or breakthrough pain. Patient not taking: Reported on 10/08/2020 03/28/18   Muthersbaugh, Jarrett Soho, PA-C  rosuvastatin (CRESTOR) 10 MG tablet Take 10 mg by mouth daily. Patient not taking: Reported on 10/08/2021 07/11/20   [provider]    Family History History reviewed. No pertinent family history.  Social History Social History   Tobacco Use   Smoking status: Never   Smokeless tobacco: Never  Vaping Use   Vaping Use: Never used  Substance Use Topics   Alcohol use: No   Drug use: Never     Allergies   Atorvastatin   Review of Systems Review of Systems Per HPI  Physical Exam Triage Vital Signs ED Triage Vitals  Enc Vitals Group     BP 10/06/22 1451 121/76     Pulse Rate 10/06/22 1451 72     Resp 10/06/22 1451 12     Temp 10/06/22 1451 98.3 F (36.8 C)     Temp Source 10/06/22 1451 Oral     SpO2 10/06/22 1451 95 %     Weight --      Height --      Head Circumference --      Peak Flow --       Pain Score 10/06/22 1447 8     Pain Loc --      Pain Edu? --      Excl. in [place]? --    No data found.  Updated Vital Signs BP 121/76 (BP Location: Right Arm)   Pulse 72   Temp 98.3 F (36.8 C) (Oral)   Resp 12   SpO2 95%   Visual Acuity Right Eye Distance:   Left Eye Distance:   Bilateral Distance:    Right Eye Near:   Left Eye Near:    Bilateral Near:     Physical Exam Vitals and nursing note reviewed.  Constitutional:      Appearance: He is not ill-appearing or toxic-appearing.  HENT:     Head: Normocephalic and atraumatic.     Right Ear: Hearing and external ear normal.     Left Ear: Hearing and external ear normal.     Nose: Nose normal.     Mouth/Throat:     Lips: Pink.     Mouth: Mucous membranes are moist.  Eyes:     General: Lids are normal. Vision grossly intact. Gaze aligned appropriately.     Extraocular Movements: Extraocular movements intact.     Conjunctiva/sclera: Conjunctivae normal.  Pulmonary:     Effort: Pulmonary effort is normal.  Musculoskeletal:     Cervical back: Neck supple.     Left knee: Swelling present. No deformity, effusion, erythema, ecchymosis or crepitus. Tenderness present over the medial joint line. Normal alignment and normal patellar mobility. Normal pulse.       Legs:     Comments: Swelling to the medial compartment of the left knee joint with associated tenderness to the medial joint line of left knee.  No ecchymosis, decreased range of motion, or crepitus.  No tenderness elicited with valgus and varus stress.  +2 left popliteal pulse.  Skin:    General: Skin is warm and dry.     Capillary Refill: Capillary refill takes less than 2 seconds.     Findings: No rash.  Neurological:     General: No focal deficit present.     Mental Status: He is alert and oriented to person, place, and time. Mental status is at baseline.     Cranial Nerves: No dysarthria or facial asymmetry.  Psychiatric:        Mood and Affect: Mood normal.         Speech: Speech normal.        Behavior: Behavior normal.        Thought Content: Thought  content normal.        Judgment: Judgment normal.      UC Treatments / Results  Labs (all labs ordered are listed, but only abnormal results are displayed) Labs Reviewed - No data to display  EKG   Radiology DG Knee Complete 4 Views Left  Result Date: 10/06/2022 CLINICAL DATA:  Knee injury. EXAM: LEFT KNEE - COMPLETE 4+ VIEW COMPARISON:  Radiographs 06/26/2008. FINDINGS: The bones appear adequately mineralized. There is no evidence of acute fracture or dislocation. Progressive medial compartment osteoarthritis with bone-on-bone apposition and associated osteophytes. There is mild spurring in the patellofemoral and lateral compartments. There is a small knee joint effusion. No other focal soft tissue abnormalities are identified. IMPRESSION: No evidence of acute fracture or dislocation. Progressive medial compartment osteoarthritis with small joint effusion. Electronically Signed   By: Richardean Sale M.D.   On: 10/06/2022 16:09    Procedures Procedures (including critical care time)  Medications Ordered in UC Medications  ketorolac (TORADOL) 30 MG/ML injection 15 mg (15 mg Intramuscular Given 10/06/22 1605)  acetaminophen (TYLENOL) tablet 975 mg (975 mg Oral Given 10/06/22 1606)    Initial Impression / Assessment and Plan / UC Course  I have reviewed the triage vital signs and the nursing notes.  Pertinent labs & imaging results that were available during my care of the patient were reviewed by me and considered in my medical decision making (see chart for details).   1.  Osteoarthritis of the left knee Left knee x-rays without acute bony abnormality, however does show bone-on-bone osteoarthritis of the left knee with medial compartment swelling.  Patient has been using ibuprofen at home, however given his age, I would like to give methylprednisolone dose pak 21 pills to be taken as  directed to reduce inflammation and pain.  He is to avoid taking ibuprofen while taking methylprednisolone and take medicine with food.  RICE advised.  Follow-up with orthopedics as directed on Thursday recommended.  Discussed physical exam and available lab work findings in clinic with patient.  Counseled patient regarding appropriate use of medications and potential side effects for all medications recommended or prescribed today. Discussed red flag signs and symptoms of worsening condition,when to call the PCP office, return to urgent care, and when to seek higher level of care in the emergency department. Patient verbalizes understanding and agreement with plan. All questions answered. Patient discharged in stable condition.    Final Clinical Impressions(s) / UC Diagnoses   Final diagnoses:  Osteoarthritis of left knee, unspecified osteoarthritis type     Discharge Instructions      Your x-ray showed significant osteoarthritis of your left knee with swelling without acute fracture or bony abnormality.  Take steroid Dosepak as prescribed starting tomorrow.  I gave you a shot of anti-inflammatory medicine in the clinic as well as Tylenol.  I would like for you to continue taking Tylenol as needed for aches and pains every 6 hours.  Do not take any ibuprofen while you are taking the steroid Dosepak as this is a sister medicine and can cause stomach problems if taken together.  Follow-up with the orthopedic doctor as scheduled on Thursday. I hope you feel better!   ED Prescriptions     Medication Sig Dispense Auth. Provider   methylPREDNISolone (MEDROL DOSEPAK) 4 MG TBPK tablet Take as directed with food. 1 each Talbot Grumbling, FNP      PDMP not reviewed this encounter.   Talbot Grumbling, [place] 10/06/22 [phone]

## 2022-10-06 NOTE — ED Triage Notes (Signed)
Pt is here for left knee pain x 2days

## 2022-10-06 NOTE — Discharge Instructions (Signed)
Your x-ray showed significant osteoarthritis of your left knee with swelling without acute fracture or bony abnormality.  Take steroid Dosepak as prescribed starting tomorrow.  I gave you a shot of anti-inflammatory medicine in the clinic as well as Tylenol.  I would like for you to continue taking Tylenol as needed for aches and pains every 6 hours.  Do not take any ibuprofen while you are taking the steroid Dosepak as this is a sister medicine and can cause stomach problems if taken together.  Follow-up with the orthopedic doctor as scheduled on Thursday. I hope you feel better!

## 2022-10-07 DIAGNOSIS — M9905 Segmental and somatic dysfunction of pelvic region: Secondary | ICD-10-CM | POA: Diagnosis not present

## 2022-10-07 DIAGNOSIS — M5136 Other intervertebral disc degeneration, lumbar region: Secondary | ICD-10-CM | POA: Diagnosis not present

## 2022-10-07 DIAGNOSIS — M9903 Segmental and somatic dysfunction of lumbar region: Secondary | ICD-10-CM | POA: Diagnosis not present

## 2022-10-08 ENCOUNTER — Ambulatory Visit: Payer: Medicare Other | Admitting: Dermatology

## 2022-10-09 DIAGNOSIS — M9905 Segmental and somatic dysfunction of pelvic region: Secondary | ICD-10-CM | POA: Diagnosis not present

## 2022-10-09 DIAGNOSIS — M1711 Unilateral primary osteoarthritis, right knee: Secondary | ICD-10-CM | POA: Diagnosis not present

## 2022-10-09 DIAGNOSIS — M1712 Unilateral primary osteoarthritis, left knee: Secondary | ICD-10-CM | POA: Diagnosis not present

## 2022-10-09 DIAGNOSIS — M9903 Segmental and somatic dysfunction of lumbar region: Secondary | ICD-10-CM | POA: Diagnosis not present

## 2022-10-09 DIAGNOSIS — M170 Bilateral primary osteoarthritis of knee: Secondary | ICD-10-CM | POA: Diagnosis not present

## 2022-10-09 DIAGNOSIS — M5136 Other intervertebral disc degeneration, lumbar region: Secondary | ICD-10-CM | POA: Diagnosis not present

## 2022-11-04 DIAGNOSIS — M1712 Unilateral primary osteoarthritis, left knee: Secondary | ICD-10-CM | POA: Diagnosis not present

## 2022-11-06 DIAGNOSIS — M1712 Unilateral primary osteoarthritis, left knee: Secondary | ICD-10-CM | POA: Diagnosis not present

## 2022-11-11 DIAGNOSIS — M1712 Unilateral primary osteoarthritis, left knee: Secondary | ICD-10-CM | POA: Diagnosis not present

## 2022-11-13 DIAGNOSIS — M1711 Unilateral primary osteoarthritis, right knee: Secondary | ICD-10-CM | POA: Diagnosis not present

## 2022-12-19 DIAGNOSIS — M170 Bilateral primary osteoarthritis of knee: Secondary | ICD-10-CM | POA: Diagnosis not present

## 2023-01-20 DIAGNOSIS — H524 Presbyopia: Secondary | ICD-10-CM | POA: Diagnosis not present

## 2023-01-20 DIAGNOSIS — H43813 Vitreous degeneration, bilateral: Secondary | ICD-10-CM | POA: Diagnosis not present

## 2023-01-20 DIAGNOSIS — H2513 Age-related nuclear cataract, bilateral: Secondary | ICD-10-CM | POA: Diagnosis not present

## 2023-01-20 DIAGNOSIS — H40023 Open angle with borderline findings, high risk, bilateral: Secondary | ICD-10-CM | POA: Diagnosis not present

## 2023-01-20 DIAGNOSIS — H5203 Hypermetropia, bilateral: Secondary | ICD-10-CM | POA: Diagnosis not present

## 2023-01-20 DIAGNOSIS — H52223 Regular astigmatism, bilateral: Secondary | ICD-10-CM | POA: Diagnosis not present

## 2023-02-18 DIAGNOSIS — Z01818 Encounter for other preprocedural examination: Secondary | ICD-10-CM | POA: Diagnosis not present

## 2023-02-18 DIAGNOSIS — M1711 Unilateral primary osteoarthritis, right knee: Secondary | ICD-10-CM | POA: Diagnosis not present

## 2023-02-18 DIAGNOSIS — M25661 Stiffness of right knee, not elsewhere classified: Secondary | ICD-10-CM | POA: Diagnosis not present

## 2023-02-18 DIAGNOSIS — M25561 Pain in right knee: Secondary | ICD-10-CM | POA: Diagnosis not present

## 2023-02-18 DIAGNOSIS — Z0189 Encounter for other specified special examinations: Secondary | ICD-10-CM | POA: Diagnosis not present

## 2023-02-20 DIAGNOSIS — M1711 Unilateral primary osteoarthritis, right knee: Secondary | ICD-10-CM | POA: Diagnosis not present

## 2023-02-20 DIAGNOSIS — Z419 Encounter for procedure for purposes other than remedying health state, unspecified: Secondary | ICD-10-CM | POA: Diagnosis not present

## 2023-03-18 DIAGNOSIS — G8918 Other acute postprocedural pain: Secondary | ICD-10-CM | POA: Diagnosis not present

## 2023-03-18 DIAGNOSIS — M25761 Osteophyte, right knee: Secondary | ICD-10-CM | POA: Diagnosis not present

## 2023-03-18 DIAGNOSIS — M1711 Unilateral primary osteoarthritis, right knee: Secondary | ICD-10-CM | POA: Diagnosis not present

## 2023-03-20 DIAGNOSIS — M25561 Pain in right knee: Secondary | ICD-10-CM | POA: Diagnosis not present

## 2023-03-20 DIAGNOSIS — M25661 Stiffness of right knee, not elsewhere classified: Secondary | ICD-10-CM | POA: Diagnosis not present

## 2023-03-23 DIAGNOSIS — M25561 Pain in right knee: Secondary | ICD-10-CM | POA: Diagnosis not present

## 2023-03-25 DIAGNOSIS — M25561 Pain in right knee: Secondary | ICD-10-CM | POA: Diagnosis not present

## 2023-03-27 DIAGNOSIS — M25561 Pain in right knee: Secondary | ICD-10-CM | POA: Diagnosis not present

## 2023-03-30 DIAGNOSIS — M25561 Pain in right knee: Secondary | ICD-10-CM | POA: Diagnosis not present

## 2023-03-30 DIAGNOSIS — M25661 Stiffness of right knee, not elsewhere classified: Secondary | ICD-10-CM | POA: Diagnosis not present

## 2023-04-03 DIAGNOSIS — M25561 Pain in right knee: Secondary | ICD-10-CM | POA: Diagnosis not present

## 2023-04-03 DIAGNOSIS — M25661 Stiffness of right knee, not elsewhere classified: Secondary | ICD-10-CM | POA: Diagnosis not present

## 2023-04-06 DIAGNOSIS — M25561 Pain in right knee: Secondary | ICD-10-CM | POA: Diagnosis not present

## 2023-04-06 DIAGNOSIS — M25661 Stiffness of right knee, not elsewhere classified: Secondary | ICD-10-CM | POA: Diagnosis not present

## 2023-04-09 DIAGNOSIS — M25661 Stiffness of right knee, not elsewhere classified: Secondary | ICD-10-CM | POA: Diagnosis not present

## 2023-04-09 DIAGNOSIS — M25561 Pain in right knee: Secondary | ICD-10-CM | POA: Diagnosis not present

## 2023-04-14 DIAGNOSIS — M25661 Stiffness of right knee, not elsewhere classified: Secondary | ICD-10-CM | POA: Diagnosis not present

## 2023-04-14 DIAGNOSIS — M25561 Pain in right knee: Secondary | ICD-10-CM | POA: Diagnosis not present

## 2023-04-16 DIAGNOSIS — M25661 Stiffness of right knee, not elsewhere classified: Secondary | ICD-10-CM | POA: Diagnosis not present

## 2023-04-16 DIAGNOSIS — M25561 Pain in right knee: Secondary | ICD-10-CM | POA: Diagnosis not present

## 2023-04-21 DIAGNOSIS — M25661 Stiffness of right knee, not elsewhere classified: Secondary | ICD-10-CM | POA: Diagnosis not present

## 2023-04-21 DIAGNOSIS — M25561 Pain in right knee: Secondary | ICD-10-CM | POA: Diagnosis not present

## 2023-04-22 DIAGNOSIS — Z5189 Encounter for other specified aftercare: Secondary | ICD-10-CM | POA: Diagnosis not present

## 2023-04-28 DIAGNOSIS — M25561 Pain in right knee: Secondary | ICD-10-CM | POA: Diagnosis not present

## 2023-04-28 DIAGNOSIS — M25661 Stiffness of right knee, not elsewhere classified: Secondary | ICD-10-CM | POA: Diagnosis not present

## 2023-04-30 DIAGNOSIS — M25661 Stiffness of right knee, not elsewhere classified: Secondary | ICD-10-CM | POA: Diagnosis not present

## 2023-04-30 DIAGNOSIS — M25561 Pain in right knee: Secondary | ICD-10-CM | POA: Diagnosis not present

## 2023-05-05 DIAGNOSIS — M25661 Stiffness of right knee, not elsewhere classified: Secondary | ICD-10-CM | POA: Diagnosis not present

## 2023-05-05 DIAGNOSIS — M25561 Pain in right knee: Secondary | ICD-10-CM | POA: Diagnosis not present

## 2023-05-12 DIAGNOSIS — M25661 Stiffness of right knee, not elsewhere classified: Secondary | ICD-10-CM | POA: Diagnosis not present

## 2023-05-12 DIAGNOSIS — M25561 Pain in right knee: Secondary | ICD-10-CM | POA: Diagnosis not present

## 2023-05-15 DIAGNOSIS — M25661 Stiffness of right knee, not elsewhere classified: Secondary | ICD-10-CM | POA: Diagnosis not present

## 2023-05-15 DIAGNOSIS — M25561 Pain in right knee: Secondary | ICD-10-CM | POA: Diagnosis not present

## 2023-05-31 ENCOUNTER — Ambulatory Visit
Admission: EM | Admit: 2023-05-31 | Discharge: 2023-05-31 | Disposition: A | Discharge status: Home / Self Care | Payer: Medicare Other | Attending: Family Medicine | Admitting: Family Medicine

## 2023-05-31 DIAGNOSIS — Z1152 Encounter for screening for COVID-19: Secondary | ICD-10-CM | POA: Insufficient documentation

## 2023-05-31 DIAGNOSIS — J069 Acute upper respiratory infection, unspecified: Secondary | ICD-10-CM | POA: Insufficient documentation

## 2023-05-31 MED ORDER — PROMETHAZINE-DM 6.25-15 MG/5ML PO SYRP: 5.0000 mL | ORAL_SOLUTION | Freq: Four times a day (QID) | ORAL | 0 refills | Status: AC | PRN

## 2023-05-31 NOTE — ED Triage Notes (Signed)
Pt reports  a scratchy throat, sinus pressure, nasal congestion, throat drainage, and body aches x 4 days   Took alk cold and flu

## 2023-05-31 NOTE — Discharge Instructions (Signed)
You may take Coricidin HBP, Flonase nasal spray twice daily, use warm saline sinus rinses several times daily as needed. your COVID test should be back tomorrow and someone will call if it is positive to discuss antiviral medications.

## 2023-05-31 NOTE — ED Provider Notes (Signed)
[facility]    CSN: [id number] Arrival date & time: 05/31/23  1330      History   Chief Complaint Chief Complaint  Patient presents with   Nasal Congestion    HPI Micheal [name] is a 72 y.o. male.   Presenting today with 4-day history of sore throat, sinus pressure, congestion, body aches, cough, fatigue.  Denies chest pain, shortness of breath, abdominal pain, nausea vomiting or diarrhea.  So far trying cold and flu medication with mild temporary benefit.  No known history of chronic pulmonary disease.  No known sick contacts recently.    Past Medical History:  Diagnosis Date   Atypical mole 11/09/2012   mod-left thigh sup   Atypical mole 11/09/2012   mild-left inferior thigh    Basal cell carcinoma 11/28/2008   right sclap- (CX35FU)   Basal cell carcinoma 12/02/2016   sup/nod- behind right ear (CX35FU)   Melanoma ([facility]) 05/07/2005   in situ- "V" of neck (MOHS)   Renal disorder     Patient Active Problem List   Diagnosis Date Noted   Pain in joint 06/26/2008   EDEMA 06/26/2008   ONYCHOMYCOSIS 01/10/2008   Overweight 01/10/2008   Calculus of gallbladder 01/10/2008   Osteoarthritis 01/10/2008   LOW BACK PAIN, MILD 01/10/2008    History reviewed. No pertinent surgical history.     Home Medications    Prior to Admission medications   Medication Sig Start Date End Date Taking? Authorizing Provider  promethazine-dextromethorphan (PROMETHAZINE-DM) 6.25-15 MG/5ML syrup Take 5 mLs by mouth 4 (four) times daily as needed. 05/31/23  Yes Particia Nearing, PA-C  cholecalciferol (VITAMIN D3) 25 MCG (1000 UNIT) tablet 1 tablet    [provider]  Coenzyme Q10 (CO Q 10) 100 MG CAPS See admin instructions.    [provider]  meloxicam (MOBIC) 15 MG tablet 1 tablet 07/30/21   [provider]  methylPREDNISolone (MEDROL DOSEPAK) 4 MG TBPK tablet Take as directed with food. 10/06/22   Carlisle Beers, FNP  oxyCODONE  (ROXICODONE) 5 MG immediate release tablet Take 1 tablet (5 mg total) by mouth every 6 (six) hours as needed for severe pain or breakthrough pain. Patient not taking: Reported on 10/08/2020 03/28/18   Muthersbaugh, Dahlia Client, PA-C  rosuvastatin (CRESTOR) 10 MG tablet Take 10 mg by mouth daily. Patient not taking: Reported on 10/08/2021 07/11/20   [provider]  rosuvastatin (CRESTOR) 5 MG tablet Take 5 mg by mouth daily. 09/06/21   [provider]    Family History History reviewed. No pertinent family history.  Social History Social History   Tobacco Use   Smoking status: Never   Smokeless tobacco: Never  Vaping Use   Vaping status: Never Used  Substance Use Topics   Alcohol use: No   Drug use: Never     Allergies   Atorvastatin   Review of Systems Review of Systems Per HPI  Physical Exam Triage Vital Signs ED Triage Vitals [05/31/23 1349]  Encounter Vitals Group     BP (!) 145/72     Systolic BP Percentile      Diastolic BP Percentile      Pulse Rate 74     Resp 20     Temp 98.5 F (36.9 C)     Temp Source Oral     SpO2 95 %     Weight      Height      Head Circumference      Peak  Flow      Pain Score 5     Pain Loc      Pain Education      Exclude from Growth Chart    No data found.  Updated Vital Signs BP (!) 145/72 (BP Location: Right Arm)   Pulse 74   Temp 98.5 F (36.9 C) (Oral)   Resp 20   SpO2 95%   Visual Acuity Right Eye Distance:   Left Eye Distance:   Bilateral Distance:    Right Eye Near:   Left Eye Near:    Bilateral Near:     Physical Exam Vitals and nursing note reviewed.  Constitutional:      Appearance: He is well-developed.  HENT:     Head: Atraumatic.     Right Ear: External ear normal.     Left Ear: External ear normal.     Nose: Rhinorrhea present.     Mouth/Throat:     Pharynx: Posterior oropharyngeal erythema present. No oropharyngeal exudate.  Eyes:     Conjunctiva/sclera: Conjunctivae normal.      Pupils: Pupils are equal, round, and reactive to light.  Cardiovascular:     Rate and Rhythm: Normal rate and regular rhythm.  Pulmonary:     Effort: Pulmonary effort is normal. No respiratory distress.     Breath sounds: No wheezing or rales.  Musculoskeletal:        General: Normal range of motion.     Cervical back: Normal range of motion and neck supple.  Lymphadenopathy:     Cervical: No cervical adenopathy.  Skin:    General: Skin is warm and dry.  Neurological:     Mental Status: He is alert and oriented to person, place, and time.  Psychiatric:        Behavior: Behavior normal.      UC Treatments / Results  Labs (all labs ordered are listed, but only abnormal results are displayed) Labs Reviewed  SARS CORONAVIRUS 2 (TAT 6-24 HRS)    EKG   Radiology No results found.  Procedures Procedures (including critical care time)  Medications Ordered in UC Medications - No data to display  Initial Impression / Assessment and Plan / UC Course  I have reviewed the triage vital signs and the nursing notes.  Pertinent labs & imaging results that were available during my care of the patient were reviewed by me and considered in my medical decision making (see chart for details).     Vitals and exam overall reassuring, suspicious for viral respiratory infection.  COVID testing pending, good candidate for molnupiravir if positive.  Treat with Phenergan DM, supportive over-the-counter medications and home care.  Return for worsening symptoms.  Final Clinical Impressions(s) / UC Diagnoses   Final diagnoses:  Viral URI with cough     Discharge Instructions      You may take Coricidin HBP, Flonase nasal spray twice daily, use warm saline sinus rinses several times daily as needed. your COVID test should be back tomorrow and someone will call if it is positive to discuss antiviral medications.    ED Prescriptions     Medication Sig Dispense Auth. Provider    promethazine-dextromethorphan (PROMETHAZINE-DM) 6.25-15 MG/5ML syrup Take 5 mLs by mouth 4 (four) times daily as needed. 100 mL Particia Nearing, [place]      PDMP not reviewed this encounter.   Particia Nearing, [place] 05/31/23 1422

## 2023-06-01 LAB — SARS CORONAVIRUS 2 (TAT 6-24 HRS): SARS Coronavirus 2: NEGATIVE

## 2023-06-05 ENCOUNTER — Ambulatory Visit
Admission: EM | Admit: 2023-06-05 | Discharge: 2023-06-05 | Disposition: A | Discharge status: Home / Self Care | Payer: Medicare Other | Attending: Nurse Practitioner | Admitting: Nurse Practitioner

## 2023-06-05 DIAGNOSIS — J0100 Acute maxillary sinusitis, unspecified: Secondary | ICD-10-CM

## 2023-06-05 MED ORDER — AMOXICILLIN-POT CLAVULANATE 875-125 MG PO TABS: 1.0000 | ORAL_TABLET | Freq: Two times a day (BID) | ORAL | 0 refills | Status: AC

## 2023-06-05 MED ORDER — PSEUDOEPH-BROMPHEN-DM 30-2-10 MG/5ML PO SYRP: 5.0000 mL | ORAL_SOLUTION | Freq: Four times a day (QID) | ORAL | 0 refills | Status: AC | PRN

## 2023-06-05 NOTE — Discharge Instructions (Signed)
Take medication as directed. Increase fluids and get plenty of rest. May take over-the-counter ibuprofen or Tylenol as needed for pain, fever, or general discomfort. Recommend normal saline nasal spray to help with nasal congestion throughout the day. For your cough, it may be helpful to use a humidifier at bedtime during sleep and sleep elevated on pillows while symptoms persist. If symptoms do not improve with this treatment, you may follow-up in this clinic or with your PCP for further evaluation.  Follow-up as needed.

## 2023-06-05 NOTE — ED Provider Notes (Signed)
[facility]    CSN: [id number] Arrival date & time: 06/05/23  1308      History   Chief Complaint No chief complaint on file.   HPI Micheal [name] is a 73 y.o. male.   The history is provided by the patient.   Patient presents for continued upper respiratory symptoms.  Patient states that he was seen in his clinic on 05/31/2023 for the same or similar symptoms.  Patient reports symptoms have not improved.  Patient continues to complain of nasal congestion, sinus pressure, headache, blurry vision, postnasal drainage, and cough.  Patient denies new symptoms of fever, chills, ear pain, wheezing, difficulty breathing, chest pain, abdominal pain, nausea, vomiting, or diarrhea.  Patient reports he has been taking over-the-counter Mucinex and another expectorant that his wife recommended.  Patient tested negative for COVID previously on 10/6. Past Medical History:  Diagnosis Date   Atypical mole 11/09/2012   mod-left thigh sup   Atypical mole 11/09/2012   mild-left inferior thigh    Basal cell carcinoma 11/28/2008   right sclap- (CX35FU)   Basal cell carcinoma 12/02/2016   sup/nod- behind right ear (CX35FU)   Melanoma (HCC) 05/07/2005   in situ- "V" of neck (MOHS)   Renal disorder     Patient Active Problem List   Diagnosis Date Noted   Pain in joint 06/26/2008   EDEMA 06/26/2008   ONYCHOMYCOSIS 01/10/2008   Overweight 01/10/2008   Calculus of gallbladder 01/10/2008   Osteoarthritis 01/10/2008   LOW BACK PAIN, MILD 01/10/2008    History reviewed. No pertinent surgical history.     Home Medications    Prior to Admission medications   Medication Sig Start Date End Date Taking? Authorizing Provider  amoxicillin-clavulanate (AUGMENTIN) 875-125 MG tablet Take 1 tablet by mouth every 12 (twelve) hours. 06/05/23  Yes Boyd Buffalo-Warren, Sadie Haber, NP  brompheniramine-pseudoephedrine-DM 30-2-10 MG/5ML syrup Take 5 mLs by mouth 4 (four) times daily as needed. 06/05/23   Yes Sharonica Kraszewski-Warren, Sadie Haber, NP  cholecalciferol (VITAMIN D3) 25 MCG (1000 UNIT) tablet 1 tablet    [provider]  Coenzyme Q10 (CO Q 10) 100 MG CAPS See admin instructions.    [provider]  meloxicam (MOBIC) 15 MG tablet 1 tablet 07/30/21   [provider]  methylPREDNISolone (MEDROL DOSEPAK) 4 MG TBPK tablet Take as directed with food. 10/06/22   Carlisle Beers, FNP  oxyCODONE (ROXICODONE) 5 MG immediate release tablet Take 1 tablet (5 mg total) by mouth every 6 (six) hours as needed for severe pain or breakthrough pain. Patient not taking: Reported on 10/08/2020 03/28/18   Muthersbaugh, Dahlia Client, PA-C  promethazine-dextromethorphan (PROMETHAZINE-DM) 6.25-15 MG/5ML syrup Take 5 mLs by mouth 4 (four) times daily as needed. 05/31/23   Particia Nearing, PA-C  rosuvastatin (CRESTOR) 10 MG tablet Take 10 mg by mouth daily. Patient not taking: Reported on 10/08/2021 07/11/20   [provider]  rosuvastatin (CRESTOR) 5 MG tablet Take 5 mg by mouth daily. 09/06/21   [provider]    Family History History reviewed. No pertinent family history.  Social History Social History   Tobacco Use   Smoking status: Never   Smokeless tobacco: Never  Vaping Use   Vaping status: Never Used  Substance Use Topics   Alcohol use: No   Drug use: Never     Allergies   Atorvastatin   Review of Systems Review of Systems Per HPI  Physical Exam Triage Vital Signs ED Triage Vitals  Encounter Vitals  Group     BP 06/05/23 0939 (!) 149/71     Systolic BP Percentile --      Diastolic BP Percentile --      Pulse Rate 06/05/23 0939 92     Resp 06/05/23 0939 18     Temp 06/05/23 0939 98.1 F (36.7 C)     Temp Source 06/05/23 0939 Oral     SpO2 06/05/23 0939 94 %     Weight --      Height --      Head Circumference --      Peak Flow --      Pain Score 06/05/23 0940 0     Pain Loc --      Pain Education --      Exclude from Growth Chart --     No data found.  Updated Vital Signs BP (!) 149/71 (BP Location: Right Arm)   Pulse 92   Temp 98.1 F (36.7 C) (Oral)   Resp 18   SpO2 94%   Visual Acuity Right Eye Distance:   Left Eye Distance:   Bilateral Distance:    Right Eye Near:   Left Eye Near:    Bilateral Near:     Physical Exam Vitals and nursing note reviewed.  Constitutional:      General: He is not in acute distress.    Appearance: Normal appearance.  HENT:     Head: Normocephalic.     Right Ear: Tympanic membrane, ear canal and external ear normal.     Left Ear: Tympanic membrane, ear canal and external ear normal.     Nose: Congestion present.     Right Turbinates: Enlarged and swollen.     Left Turbinates: Enlarged and swollen.     Right Sinus: Maxillary sinus tenderness present. No frontal sinus tenderness.     Left Sinus: Maxillary sinus tenderness present. No frontal sinus tenderness.     Mouth/Throat:     Mouth: Mucous membranes are moist.     Pharynx: Oropharynx is clear. Uvula midline. Posterior oropharyngeal erythema and postnasal drip present.     Comments: Cobblestoning present to posterior oropharynx  Eyes:     Extraocular Movements: Extraocular movements intact.     Conjunctiva/sclera: Conjunctivae normal.     Pupils: Pupils are equal, round, and reactive to light.  Cardiovascular:     Rate and Rhythm: Normal rate and regular rhythm.     Pulses: Normal pulses.     Heart sounds: Normal heart sounds.  Pulmonary:     Effort: Pulmonary effort is normal. No respiratory distress.     Breath sounds: Normal breath sounds. No stridor. No wheezing, rhonchi or rales.  Abdominal:     General: Bowel sounds are normal.     Palpations: Abdomen is soft.     Tenderness: There is no abdominal tenderness.  Musculoskeletal:     Cervical back: Normal range of motion.  Lymphadenopathy:     Cervical: No cervical adenopathy.  Skin:    General: Skin is warm and dry.  Neurological:     General: No  focal deficit present.     Mental Status: He is alert and oriented to person, place, and time.  Psychiatric:        Mood and Affect: Mood normal.        Behavior: Behavior normal.      UC Treatments / Results  Labs (all labs ordered are listed, but only abnormal results are displayed) Labs Reviewed - No  data to display  EKG   Radiology No results found.  Procedures Procedures (including critical care time)  Medications Ordered in UC Medications - No data to display  Initial Impression / Assessment and Plan / UC Course  I have reviewed the triage vital signs and the nursing notes.  Pertinent labs & imaging results that were available during my care of the patient were reviewed by me and considered in my medical decision making (see chart for details).  The patient is well-appearing, he is in no acute distress, vital signs are stable.  Patient has been experiencing symptoms for approximately 10 days or greater with minimal relief despite use of over-the-counter medications and previously prescribed medication.  He continues to experience headache, sinus pressure, and cough.  Symptoms consistent with acute maxillary sinusitis.  Will treat empirically with Augmentin 875/125 mg tablets, and for his cough, Bromfed-DM was prescribed.  Supportive care recommendations were provided and discussed with the patient to include increasing fluids, allowing for plenty of rest, over-the-counter Tylenol, and normal saline nasal spray.  Patient was advised if symptoms do not improve with this treatment, it is recommended that he follow-up with his PCP or in this clinic for further evaluation.  Patient is in agreement with this plan of care and verbalizes understanding.  All questions were answered.  Patient stable for discharge.  Final Clinical Impressions(s) / UC Diagnoses   Final diagnoses:  Acute maxillary sinusitis, recurrence not specified     Discharge Instructions      Take  medication as directed. Increase fluids and get plenty of rest. May take over-the-counter ibuprofen or Tylenol as needed for pain, fever, or general discomfort. Recommend normal saline nasal spray to help with nasal congestion throughout the day. For your cough, it may be helpful to use a humidifier at bedtime during sleep and sleep elevated on pillows while symptoms persist. If symptoms do not improve with this treatment, you may follow-up in this clinic or with your PCP for further evaluation.  Follow-up as needed.      ED Prescriptions     Medication Sig Dispense Auth. Provider   amoxicillin-clavulanate (AUGMENTIN) 875-125 MG tablet Take 1 tablet by mouth every 12 (twelve) hours. 14 tablet Ina Scrivens-Warren, Sadie Haber, NP   brompheniramine-pseudoephedrine-DM 30-2-10 MG/5ML syrup Take 5 mLs by mouth 4 (four) times daily as needed. 140 mL Monserrat Vidaurri-Warren, Sadie Haber, NP      PDMP not reviewed this encounter.   Abran Cantor, NP 06/05/23 1015

## 2023-06-05 NOTE — ED Triage Notes (Signed)
Pt reports he has head  tightness, blurry eyes, throat drainage, cough, and headache x 9 days.     Took meds prescribed on 10/6 by UC.

## 2023-09-15 DIAGNOSIS — D2261 Melanocytic nevi of right upper limb, including shoulder: Secondary | ICD-10-CM | POA: Diagnosis not present

## 2023-09-15 DIAGNOSIS — C44319 Basal cell carcinoma of skin of other parts of face: Secondary | ICD-10-CM | POA: Diagnosis not present

## 2023-09-15 DIAGNOSIS — Z85828 Personal history of other malignant neoplasm of skin: Secondary | ICD-10-CM | POA: Diagnosis not present

## 2023-09-15 DIAGNOSIS — D1801 Hemangioma of skin and subcutaneous tissue: Secondary | ICD-10-CM | POA: Diagnosis not present

## 2023-09-15 DIAGNOSIS — Z85820 Personal history of malignant melanoma of skin: Secondary | ICD-10-CM | POA: Diagnosis not present

## 2023-09-15 DIAGNOSIS — D485 Neoplasm of uncertain behavior of skin: Secondary | ICD-10-CM | POA: Diagnosis not present

## 2023-09-15 DIAGNOSIS — L814 Other melanin hyperpigmentation: Secondary | ICD-10-CM | POA: Diagnosis not present

## 2023-09-15 DIAGNOSIS — L570 Actinic keratosis: Secondary | ICD-10-CM | POA: Diagnosis not present

## 2023-09-15 DIAGNOSIS — D225 Melanocytic nevi of trunk: Secondary | ICD-10-CM | POA: Diagnosis not present

## 2023-09-15 DIAGNOSIS — L821 Other seborrheic keratosis: Secondary | ICD-10-CM | POA: Diagnosis not present

## 2023-10-12 DIAGNOSIS — H43813 Vitreous degeneration, bilateral: Secondary | ICD-10-CM | POA: Diagnosis not present

## 2023-10-12 DIAGNOSIS — H2513 Age-related nuclear cataract, bilateral: Secondary | ICD-10-CM | POA: Diagnosis not present

## 2023-10-12 DIAGNOSIS — H40023 Open angle with borderline findings, high risk, bilateral: Secondary | ICD-10-CM | POA: Diagnosis not present

## 2023-10-20 DIAGNOSIS — C44319 Basal cell carcinoma of skin of other parts of face: Secondary | ICD-10-CM | POA: Diagnosis not present

## 2023-10-20 DIAGNOSIS — Z85820 Personal history of malignant melanoma of skin: Secondary | ICD-10-CM | POA: Diagnosis not present

## 2023-10-20 DIAGNOSIS — Z85828 Personal history of other malignant neoplasm of skin: Secondary | ICD-10-CM | POA: Diagnosis not present

## 2024-01-25 DIAGNOSIS — Z0000 Encounter for general adult medical examination without abnormal findings: Secondary | ICD-10-CM | POA: Diagnosis not present

## 2024-01-25 DIAGNOSIS — Z6832 Body mass index (BMI) 32.0-32.9, adult: Secondary | ICD-10-CM | POA: Diagnosis not present

## 2024-01-25 DIAGNOSIS — E7800 Pure hypercholesterolemia, unspecified: Secondary | ICD-10-CM | POA: Diagnosis not present

## 2024-01-25 DIAGNOSIS — Z1331 Encounter for screening for depression: Secondary | ICD-10-CM | POA: Diagnosis not present

## 2024-03-03 DIAGNOSIS — M25511 Pain in right shoulder: Secondary | ICD-10-CM | POA: Diagnosis not present

## 2024-03-03 DIAGNOSIS — M25512 Pain in left shoulder: Secondary | ICD-10-CM | POA: Diagnosis not present

## 2024-03-03 DIAGNOSIS — M79645 Pain in left finger(s): Secondary | ICD-10-CM | POA: Diagnosis not present

## 2024-03-03 DIAGNOSIS — M79644 Pain in right finger(s): Secondary | ICD-10-CM | POA: Diagnosis not present

## 2024-03-03 DIAGNOSIS — Z96651 Presence of right artificial knee joint: Secondary | ICD-10-CM | POA: Diagnosis not present

## 2024-04-14 DIAGNOSIS — M79645 Pain in left finger(s): Secondary | ICD-10-CM | POA: Diagnosis not present

## 2024-04-14 DIAGNOSIS — M79644 Pain in right finger(s): Secondary | ICD-10-CM | POA: Diagnosis not present

## 2024-04-14 DIAGNOSIS — M180 Bilateral primary osteoarthritis of first carpometacarpal joints: Secondary | ICD-10-CM | POA: Diagnosis not present

## 2024-04-15 DIAGNOSIS — M25511 Pain in right shoulder: Secondary | ICD-10-CM | POA: Diagnosis not present

## 2024-04-15 DIAGNOSIS — M25512 Pain in left shoulder: Secondary | ICD-10-CM | POA: Diagnosis not present

## 2024-04-20 DIAGNOSIS — H2513 Age-related nuclear cataract, bilateral: Secondary | ICD-10-CM | POA: Diagnosis not present

## 2024-04-20 DIAGNOSIS — H43813 Vitreous degeneration, bilateral: Secondary | ICD-10-CM | POA: Diagnosis not present

## 2024-04-20 DIAGNOSIS — H40023 Open angle with borderline findings, high risk, bilateral: Secondary | ICD-10-CM | POA: Diagnosis not present

## 2024-04-20 DIAGNOSIS — H5203 Hypermetropia, bilateral: Secondary | ICD-10-CM | POA: Diagnosis not present

## 2024-04-20 DIAGNOSIS — H524 Presbyopia: Secondary | ICD-10-CM | POA: Diagnosis not present

## 2024-04-20 DIAGNOSIS — H52223 Regular astigmatism, bilateral: Secondary | ICD-10-CM | POA: Diagnosis not present

## 2024-07-13 DIAGNOSIS — M25511 Pain in right shoulder: Secondary | ICD-10-CM | POA: Diagnosis not present

## 2024-07-13 DIAGNOSIS — M25512 Pain in left shoulder: Secondary | ICD-10-CM | POA: Diagnosis not present
